# Patient Record
Sex: Female | Born: 1995 | Race: White | Hispanic: No | Marital: Married | Smoking: Current every day smoker
Health system: Southern US, Community
[De-identification: ages and names within clinical notes are randomized; demographics above are authoritative.]

## PROBLEM LIST (undated history)

## (undated) ENCOUNTER — Inpatient Hospital Stay (HOSPITAL_COMMUNITY): Payer: Self-pay

## (undated) DIAGNOSIS — R519 Headache, unspecified: Secondary | ICD-10-CM

## (undated) DIAGNOSIS — R51 Headache: Secondary | ICD-10-CM

## (undated) DIAGNOSIS — N390 Urinary tract infection, site not specified: Secondary | ICD-10-CM

## (undated) HISTORY — PX: TOOTH EXTRACTION: SUR596

## (undated) HISTORY — PX: NO PAST SURGERIES: SHX2092

---

## 2016-08-12 NOTE — L&D Delivery Note (Addendum)
Delivery Note Pt progressed to complete dilation and pushed well for about 20 minutes.  At 4:00 PM a healthy female was delivered via Vaginal, Spontaneous (Presentation: LOP  ).  APGAR: 8, 9; weight  pending.   Placenta status: delivered spontaneously .  Cord:  with the following complications:  Short cord  Anesthesia:  epidural Episiotomy: None Lacerations: 1st degree abrasion Suture Repair: 3.0 vicryl rapide Est. Blood Loss (mL): 175mL  Mom to postpartum.  Baby to Couplet care / Skin to Skin. 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    Pt BP has been intermittently elevated, but mostly with pain or pushing.  Labs WNL on admission, will send urine for prot:creat ratio and see how BP looks over next hour.  D/w parents circumcision and they desire.  D/w them office vs hospital options and they will consider.   Oliver PilaKathy W Nichalas Coin 06/17/2017, 4:25 PM

## 2017-01-03 LAB — OB RESULTS CONSOLE RUBELLA ANTIBODY, IGM: Rubella: IMMUNE

## 2017-01-03 LAB — OB RESULTS CONSOLE HEPATITIS B SURFACE ANTIGEN: Hepatitis B Surface Ag: NEGATIVE

## 2017-02-19 ENCOUNTER — Other Ambulatory Visit (HOSPITAL_COMMUNITY): Payer: Self-pay | Admitting: Obstetrics and Gynecology

## 2017-02-19 DIAGNOSIS — Z3A19 19 weeks gestation of pregnancy: Secondary | ICD-10-CM

## 2017-02-19 DIAGNOSIS — Z3689 Encounter for other specified antenatal screening: Secondary | ICD-10-CM

## 2017-02-21 ENCOUNTER — Encounter (HOSPITAL_COMMUNITY): Payer: Self-pay | Admitting: *Deleted

## 2017-02-25 ENCOUNTER — Ambulatory Visit (HOSPITAL_COMMUNITY)
Admission: RE | Admit: 2017-02-25 | Discharge: 2017-02-25 | Disposition: A | Discharge status: Home / Self Care | Payer: Medicaid Other | Source: Ambulatory Visit | Attending: Obstetrics and Gynecology | Admitting: Obstetrics and Gynecology

## 2017-02-25 ENCOUNTER — Encounter (HOSPITAL_COMMUNITY): Payer: Self-pay

## 2017-02-25 ENCOUNTER — Other Ambulatory Visit (HOSPITAL_COMMUNITY): Payer: Self-pay | Admitting: Obstetrics and Gynecology

## 2017-02-25 DIAGNOSIS — Z3689 Encounter for other specified antenatal screening: Secondary | ICD-10-CM

## 2017-02-25 DIAGNOSIS — O358XX0 Maternal care for other (suspected) fetal abnormality and damage, not applicable or unspecified: Secondary | ICD-10-CM

## 2017-02-25 DIAGNOSIS — O35HXX0 Maternal care for other (suspected) fetal abnormality and damage, fetal lower extremities anomalies, not applicable or unspecified: Secondary | ICD-10-CM

## 2017-02-25 DIAGNOSIS — Z3A19 19 weeks gestation of pregnancy: Secondary | ICD-10-CM

## 2017-02-25 DIAGNOSIS — Z363 Encounter for antenatal screening for malformations: Secondary | ICD-10-CM | POA: Diagnosis not present

## 2017-02-25 DIAGNOSIS — Z315 Encounter for genetic counseling: Secondary | ICD-10-CM | POA: Diagnosis present

## 2017-02-25 DIAGNOSIS — O99332 Smoking (tobacco) complicating pregnancy, second trimester: Secondary | ICD-10-CM

## 2017-02-25 DIAGNOSIS — O99212 Obesity complicating pregnancy, second trimester: Secondary | ICD-10-CM

## 2017-02-25 DIAGNOSIS — Z3686 Encounter for antenatal screening for cervical length: Secondary | ICD-10-CM | POA: Diagnosis not present

## 2017-02-26 ENCOUNTER — Other Ambulatory Visit (HOSPITAL_COMMUNITY): Payer: Self-pay | Admitting: *Deleted

## 2017-02-26 ENCOUNTER — Encounter (HOSPITAL_COMMUNITY): Payer: Self-pay

## 2017-02-26 DIAGNOSIS — O359XX0 Maternal care for (suspected) fetal abnormality and damage, unspecified, not applicable or unspecified: Secondary | ICD-10-CM

## 2017-02-27 DIAGNOSIS — O358XX0 Maternal care for other (suspected) fetal abnormality and damage, not applicable or unspecified: Secondary | ICD-10-CM | POA: Insufficient documentation

## 2017-02-27 DIAGNOSIS — Z3A19 19 weeks gestation of pregnancy: Secondary | ICD-10-CM | POA: Insufficient documentation

## 2017-02-27 DIAGNOSIS — O35HXX0 Maternal care for other (suspected) fetal abnormality and damage, fetal lower extremities anomalies, not applicable or unspecified: Secondary | ICD-10-CM | POA: Insufficient documentation

## 2017-02-27 NOTE — Progress Notes (Signed)
Genetic Counseling  High-Risk Gestation Note  Appointment Date:  02/25/2017 Referred By: Sharon [name], * Date of Birth:  17-Jun-1996 Partner:  Sharon [name]   Pregnancy History: G1P0 Estimated Date of Delivery: 07/17/17 Estimated Gestational Age: 70w5dAttending: [name]   I met with Mrs. Sharon Buczkowskiand her husband, Mr. 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 The patient's mother also accompanied the couple to today's visit.    In summary:  Discussed ultrasound findings in detail: right club foot visualized; facial profile unable to be assessed today [facility]  Reviewed options for additional screening  NIPS- elected to pursue Panorama today  Ongoing ultrasound  Reviewed options for diagnostic testing, including risks, benefits, limitations and alternatives  Amniocentesis- declined  Reviewed other explanations for ultrasound findings  Discussed option of prenatal consultation with pediatric orthopedics- couple would desire [place] facilitate this  Reviewed family history concerns  We began by reviewing the ultrasound in detail. Right club foot was visualized today; the left fetal foot was visualized within normal limits. Concern for absent nasal bone on outside ultrasound; facial profile not well visualized today. Remaining visualized fetal anatomy was within normal limits. Fetal heart and palate not well visualized. Complete ultrasound results under separate cover.   We discussed that clubfoot/feet is a term that actually describes three distinct anomalies (talipes equinovarus, talipes calcaneovalgus, and metatarsus varus) and occurs in 1 in 1000 births. The most common type of clubfeet, talipes equinovarus, is characterized by forefoot adduction with supination, heel varus, and ankle equines, which cannot be brought back to a neutral  position. They were counseled that clubfeet can be an isolated difference, occur as a feature of an underlying syndrome, or the result of neurological impairment. We discussed that the most likely mode of inheritance for nonsyndromic, isolated clubfoot/feet is multifactorial. There is a known genetic component for multifactorial clubfoot, as demonstrated by twin studies; environmental factors also play a role, including infection, drugs, and intrauterine environment (oligohydramnios, fetal positioning). While the majority of cases of clubfoot/feet are isolated, it is a feature in more than 200 known genetic syndromes, including both chromosomal and single gene conditions. We reviewed chromosomes, nondisjunction and the features of Down syndrome, trisomy 173 and 174 We discussed that the risk for other chromosome aberrations is slightly increased (microdeletions, microduplications, insertions, translocations). We reviewed single gene conditions including common inheritance patterns and associated risks for recurrence.   Given the possible concern for absent nasal bone, we reviewed the association with this as a soft marker for fetal aneuploidy.  An absent nasal bone is a highly sensitive and specific marker for Down syndrome.  It is present in approximately 1% of chromosomally normal fetuses, but up to 70% of fetuses with Down syndrome, 55 % with trisomy 18, and 34% with trisomy 13. The presence and length of the fetal nasal bone varies by race and ethnicity in second trimester fetuses.  The associated risk of aneuploidy is highest in Caucasians, with a likelihood ratio of approximately 50, in African Americans the likelihood ratio is approximately 8.  Given [name]'s maternal age of 21y.o., we discussed the approximate 1 in 1140 risk for fetal Down syndrome.  In the case of an ultrasound finding of an absent nasal bone, the adjusted risk for fetal Down syndrome would be approximately 1 in 23 (4.3%).   This  couple was counseled regarding the  option of noninvasive prenatal screening (NIPS)/cell free DNA (cfDNA) testing. We reviewed that although highly specific and sensitive, this testing is not considered to be diagnostic and does not detect all chromosome aberrations. We reviewed the detection and false positive rates of NIPS (Panorama) as well as the potential associated cost. We then discussed the option of amniocentesis, including the limitations, benefits, and risks. They understand that amniocentesis allows evaluation of the fetal chromosomes, but cannot detect all genetic conditions. Specifically, we discussed that single gene conditions are difficult to diagnose prenatally unless a specific condition is suspected based on additional ultrasound findings or family history. Additionally, we discussed the availability of microarray analysis, which can be performed pre and postnatally. They were counseled that microarray analysis is a molecular based technique in which a test sample of DNA (fetal) is compared to a reference (normal) genome in order to determine if the test sample has any extra or missing genetic information. Microarray analysis allows for the detection of genetic deletions and duplications that are 672 times smaller than those identified by routine chromosome analysis. We discussed that recent publications show that approximately 6% of patients with an abnormal fetal ultrasound and a normal fetal karyotype had a significant microdeletion/microduplication detected by prenatal microarray analysis. After thoughtful consideration, this couple elected to have NIPS (Panorama) today and declined amniocentesis. The results will be available in ~7-10 days. She understands that ultrasound cannot rule out all birth defects or genetic syndromes. The patient was advised of this limitation and states she still does not want diagnostic testing at this time.   We discussed the option of meeting with a pediatric  orthopedic specialist to discuss expectant management and treatment of clubfeet. They would like to pursue treatment for their child at [facility]. We will facilitate a prenatal consult with orthopedics for the couple. Follow-up ultrasound was scheduled in 4 weeks in our office.   Both family histories were reviewed and found to be contributory for Down syndrome for the patient's paternal first cousin once removed. The type of Down syndrome in this relative was not known. We discussed that 95% of cases of Down syndrome are not inherited and are the result of non-disjunction.  Three to 4% of cases of Down syndrome are the result of a translocation involving chromosome #21.  We discussed the option of chromosome analysis to determine if an individual is a carrier of a balanced translocation involving chromosome #21.  If an individual carries a balanced translocation involving chromosome #21, then the chance to have a baby with Down syndrome would be greater than the maternal age-related risk.  The reported family history is most suggestive of sporadically occurring Down syndrome. Additional information regarding the etiology for this relative may alter recurrence risk assessment.   [name] reported a maternal half-sister who had brain damage and possible cerebral palsy. She died at age 61 years old. The patient's mother reported that the features were most likely attributed to issues at labor and delivery. No underlying genetic etiology was determined to the best of the family's knowledge.  Cerebral palsy (CP) is a group of clinical syndromes that range in severity, characterized by abnormal muscle tone, posture, and movement. Cerebral palsy is due to abnormalities in the developing brain resulting from a variety of causes. The etiology is reported to be multifactorial, with most cases typically due to prenatal factors. Prematurity is the most common  association, but in many cases, no cause is identified. A  specific genetic cause for cerebral palsy has not been identified, and underlying genetic disorders are relatively uncommon in individuals with CP.  Given the reported family history, recurrence risk for the current pregnancy would likely be low. Additional information regarding this individual's underlying condition or etiology may alter recurrence risk assessment. Without further information regarding the provided family history, an accurate genetic risk cannot be calculated, and further prenatal genetic or screening would not likely be informative regarding this family history. Further genetic counseling is warranted if more information is obtained.  Additionally, [name] reportedly was identified on prenatal ultrasound to have short legs. She reported that her height is 5'2, and she feels that her legs are not proportionate to her body. She has not been diagnosed with an underlying form of skeletal dysplasia, to her knowledge and reported no additional medical concerns. The family histories were otherwise unremarkable for birth defects, intellectual disability, and known genetic conditions. Consanguinity was denied. Without further information regarding the provided family history, an accurate genetic risk cannot be calculated. Further genetic counseling is warranted if more information is obtained.  [name] was provided with written information regarding cystic fibrosis (CF), spinal muscular atrophy (SMA) and hemoglobinopathies including the carrier frequency, availability of carrier screening and prenatal diagnosis if indicated.  In addition, we discussed that CF and hemoglobinopathies are routinely screened for as part of the [place] newborn screening panel.  She declined screening for CF, SMA and hemoglobinopathies.  [name] denied exposure to environmental toxins or chemical agents. She denied the use of alcohol, tobacco or street  drugs. She denied significant viral illnesses during the course of her pregnancy. Her medical and surgical histories were noncontributory.   I counseled this couple regarding the above risks and available options.  The approximate face-to-face time with the genetic counselor was 40 minutes.  Chipper Oman, MS Certified Genetic Counselor 02/27/2017

## 2017-03-04 ENCOUNTER — Other Ambulatory Visit: Payer: Self-pay

## 2017-03-04 ENCOUNTER — Telehealth (HOSPITAL_COMMUNITY): Payer: Self-pay | Admitting: MS"

## 2017-03-04 NOTE — Telephone Encounter (Signed)
Called Sharon ShareFaith Flagler to discuss her prenatal cell free DNA test results.  [name] had Panorama testing through [facility].  Testing was offered because of ultrasound findings.   The patient was identified by name and DOB.  We reviewed that these are within normal limits, showing a less than 1 in 10,000 risk for trisomies 21, 18 and 13, and monosomy X (Turner syndrome).  In addition, the risk for triploidy and sex chromosome trisomies (47,XXX and 47,XXY) was also low risk.  We reviewed that this testing identifies > 99% of pregnancies with trisomy 2521, trisomy 5913, sex chromosome trisomies (47,XXX and 47,XXY), and triploidy. The detection rate for trisomy 18 is 98%.  The detection rate for monosomy X is ~92%.  The false positive rate is <0.1% for all conditions. Testing was also consistent with female fetal sex.  She understands that this testing does not identify all genetic conditions.  All questions were answered to her satisfaction, she was encouraged to call with additional questions or concerns.  Quinn PlowmanKaren Junella Domke, MS Certified Genetic Counselor 03/04/2017 3:46 PM

## 2017-03-26 ENCOUNTER — Encounter (HOSPITAL_COMMUNITY): Payer: Self-pay

## 2017-03-26 ENCOUNTER — Ambulatory Visit (HOSPITAL_COMMUNITY)
Admission: RE | Admit: 2017-03-26 | Discharge: 2017-03-26 | Disposition: A | Discharge status: Home / Self Care | Payer: Medicaid Other | Source: Ambulatory Visit | Attending: Obstetrics and Gynecology | Admitting: Obstetrics and Gynecology

## 2017-03-26 DIAGNOSIS — E669 Obesity, unspecified: Secondary | ICD-10-CM | POA: Diagnosis not present

## 2017-03-26 DIAGNOSIS — O99332 Smoking (tobacco) complicating pregnancy, second trimester: Secondary | ICD-10-CM | POA: Insufficient documentation

## 2017-03-26 DIAGNOSIS — Z6838 Body mass index (BMI) 38.0-38.9, adult: Secondary | ICD-10-CM | POA: Diagnosis not present

## 2017-03-26 DIAGNOSIS — O359XX0 Maternal care for (suspected) fetal abnormality and damage, unspecified, not applicable or unspecified: Secondary | ICD-10-CM | POA: Diagnosis not present

## 2017-03-26 DIAGNOSIS — Z3A23 23 weeks gestation of pregnancy: Secondary | ICD-10-CM | POA: Insufficient documentation

## 2017-03-26 DIAGNOSIS — O99212 Obesity complicating pregnancy, second trimester: Secondary | ICD-10-CM | POA: Insufficient documentation

## 2017-03-26 LAB — OB RESULTS CONSOLE ABO/RH: RH Type: POSITIVE

## 2017-04-23 LAB — OB RESULTS CONSOLE RPR: RPR: NONREACTIVE

## 2017-04-23 LAB — OB RESULTS CONSOLE HIV ANTIBODY (ROUTINE TESTING)
HIV: NONREACTIVE
HIV: NONREACTIVE

## 2017-05-20 ENCOUNTER — Inpatient Hospital Stay (HOSPITAL_COMMUNITY)
Admission: AD | Admit: 2017-05-20 | Discharge: 2017-05-21 | Disposition: A | Discharge status: Home / Self Care | Payer: Medicaid Other | Source: Ambulatory Visit | Attending: Obstetrics and Gynecology | Admitting: Obstetrics and Gynecology

## 2017-05-20 ENCOUNTER — Encounter (HOSPITAL_COMMUNITY): Payer: Self-pay | Admitting: *Deleted

## 2017-05-20 DIAGNOSIS — Z3689 Encounter for other specified antenatal screening: Secondary | ICD-10-CM

## 2017-05-20 DIAGNOSIS — R51 Headache: Secondary | ICD-10-CM | POA: Diagnosis present

## 2017-05-20 DIAGNOSIS — R42 Dizziness and giddiness: Secondary | ICD-10-CM | POA: Insufficient documentation

## 2017-05-20 DIAGNOSIS — O26893 Other specified pregnancy related conditions, third trimester: Secondary | ICD-10-CM | POA: Diagnosis present

## 2017-05-20 DIAGNOSIS — O99333 Smoking (tobacco) complicating pregnancy, third trimester: Secondary | ICD-10-CM | POA: Insufficient documentation

## 2017-05-20 DIAGNOSIS — Z3A31 31 weeks gestation of pregnancy: Secondary | ICD-10-CM | POA: Diagnosis not present

## 2017-05-20 DIAGNOSIS — O133 Gestational [pregnancy-induced] hypertension without significant proteinuria, third trimester: Secondary | ICD-10-CM | POA: Insufficient documentation

## 2017-05-20 HISTORY — DX: Urinary tract infection, site not specified: N39.0

## 2017-05-20 HISTORY — DX: Headache, unspecified: R51.9

## 2017-05-20 HISTORY — DX: Headache: R51

## 2017-05-20 NOTE — MAU Note (Signed)
Pt states she has a HA that started around 9pm. Went to fire dept to have BP checked and was 150/90. Pt has not taken anything for HA. Pt reports she is being monitored for HBP in office. Pt states every now and then she has blurry vision. Reports feels dizzy at times. Pt denies vaginal bleeding. Pt reports clear vaginal discharge. Reports decrease in fetal movement today.

## 2017-05-21 DIAGNOSIS — O133 Gestational [pregnancy-induced] hypertension without significant proteinuria, third trimester: Secondary | ICD-10-CM

## 2017-05-21 LAB — COMPREHENSIVE METABOLIC PANEL
ALBUMIN: 2.8 g/dL — AB (ref 3.5–5.0)
ALK PHOS: 106 U/L (ref 38–126)
ALT: 14 U/L (ref 14–54)
AST: 16 U/L (ref 15–41)
Anion gap: 10 (ref 5–15)
BUN: 7 mg/dL (ref 6–20)
CALCIUM: 8.9 mg/dL (ref 8.9–10.3)
CO2: 24 mmol/L (ref 22–32)
CREATININE: 0.5 mg/dL (ref 0.44–1.00)
Chloride: 105 mmol/L (ref 101–111)
GFR calc Af Amer: >60 mL/min (ref >60)
GFR calc non Af Amer: >60 mL/min (ref >60)
GLUCOSE: 86 mg/dL (ref 65–99)
POTASSIUM: 3.6 mmol/L (ref 3.5–5.1)
Sodium: 139 mmol/L (ref 135–145)
TOTAL PROTEIN: 6.7 g/dL (ref 6.5–8.1)
Total Bilirubin: 0.4 mg/dL (ref 0.3–1.2)

## 2017-05-21 LAB — URINALYSIS, ROUTINE W REFLEX MICROSCOPIC
Bilirubin Urine: NEGATIVE
Glucose, UA: NEGATIVE mg/dL
Hgb urine dipstick: NEGATIVE
Ketones, ur: NEGATIVE mg/dL
LEUKOCYTES UA: NEGATIVE
NITRITE: NEGATIVE
PROTEIN: NEGATIVE mg/dL
SPECIFIC GRAVITY, URINE: 1.008 (ref 1.005–1.030)
pH: 7 (ref 5.0–8.0)

## 2017-05-21 LAB — WET PREP, GENITAL
Clue Cells Wet Prep HPF POC: NONE SEEN
SPERM: NONE SEEN
Trich, Wet Prep: NONE SEEN
YEAST WET PREP: NONE SEEN

## 2017-05-21 LAB — CBC
HEMATOCRIT: 34.7 % — AB (ref 36.0–46.0)
HEMOGLOBIN: 11.9 g/dL — AB (ref 12.0–15.0)
MCH: 32.1 pg (ref 26.0–34.0)
MCHC: 34.3 g/dL (ref 30.0–36.0)
MCV: 93.5 fL (ref 78.0–100.0)
Platelets: 222 10*3/uL (ref 150–400)
RBC: 3.71 MIL/uL — AB (ref 3.87–5.11)
RDW: 13 % (ref 11.5–15.5)
WBC: 12.6 10*3/uL — AB (ref 4.0–10.5)

## 2017-05-21 LAB — PROTEIN / CREATININE RATIO, URINE
Creatinine, Urine: 58 mg/dL
Total Protein, Urine: <6 mg/dL

## 2017-05-21 LAB — OB RESULTS CONSOLE GC/CHLAMYDIA
Chlamydia: NEGATIVE
GC PROBE AMP, GENITAL: NEGATIVE
Gonorrhea: NEGATIVE

## 2017-05-21 LAB — GC/CHLAMYDIA PROBE AMP (~~LOC~~) NOT AT ARMC
CHLAMYDIA, DNA PROBE: NEGATIVE
Neisseria Gonorrhea: NEGATIVE

## 2017-05-21 MED ORDER — ACETAMINOPHEN 500 MG PO TABS
1000.0000 mg | ORAL_TABLET | Freq: Once | ORAL | Status: AC
  Administered 2017-05-21: 1000 mg via ORAL
  Filled 2017-05-21: qty 2

## 2017-05-21 NOTE — Progress Notes (Signed)
FHR from this am reviewed.  Reactive NST, no significant decels or regular ctx.

## 2017-05-21 NOTE — MAU Provider Note (Signed)
History     CSN: [id number]  Arrival date and time: 05/20/17 2301   First Provider Initiated Contact with Patient 05/21/17 0026      Chief Complaint  Patient presents with  . Headache  . Hypertension    HPI: Sharon [name] is a 21 y.o. G1P0 with IUP at [redacted]w[redacted]d who presents to maternity admissions d/t swelling, dizziness and elevated blood pressure. She reports she has noticed LE and UE swelling for the past few days, and dinze yesterday she has had intermittent dizziness associated with blurry vision and has had episodes of seeing flashes of light. She started having a headache around 9 pm, which she describes as dull and mild, but she was told to get her BP checked, which she did at the fire department, and her BP was 150/90. Denies RUQ or epigastric pain, SOB, or chest pain/pressure.   Denies contractions, leakage of fluid or vaginal bleeding, but reports she has been having a thin discharge for a few day with some mild odor. Also reports decreased fetal movement this evening. Reports only felt baby move twice in the last 2 hours, but states that this is since she has been going to fire department and here. Has had normal fetal movement throughout the day.   Past obstetric history: OB History  Gravida Para Term Preterm AB Living  1            SAB TAB Ectopic Multiple Live Births               # Outcome Date GA Lbr Len/2nd Weight Sex Delivery Anes PTL Lv  1 Current               Past Medical History:  Diagnosis Date  . Headache   . Medical history non-contributory   . UTI (urinary tract infection)     Past Surgical History:  Procedure Laterality Date  . NO PAST SURGERIES    . TOOTH EXTRACTION      Family History  Problem Relation Age of Onset  . Diabetes Maternal Grandmother     Social History  Substance Use Topics  . Smoking status: Current Every Day Smoker    Packs/day: 0.25    Types: Cigarettes  . Smokeless tobacco: Never Used  . Alcohol use No    Allergies:   Allergies  Allergen Reactions  . Penicillins     All "cillins"    Prescriptions Prior to Admission  Medication Sig Dispense Refill Last Dose  . Prenatal MV-Min-Fe Fum-FA-DHA (PRENATAL 1 PO) Take by mouth.   05/20/2017 at Unknown time    Review of Systems - Negative except for what is mentioned in HPI.  Physical Exam   Blood pressure (!) 141/86, pulse (!) 109, temperature 98.1 F (36.7 C), temperature source Oral, resp. rate 17, height (P)  (1.575 m), weight (P) 234 lb (106.1 kg), last menstrual period 10/10/2016.  Constitutional: Well-developed, well-nourished female in no acute distress.  HENT: [place]/AT, normal oropharynx mucosa. MMM. Eyes: normal conjunctivae, no scleral icterus. EOMI. PERRL. Cardiovascular: normal rate, regular rhythm Respiratory: normal effort, lungs CTAB, no wheezes, rales or ronchi GI: Abd soft, non-tender, gravid appropriate for gestational age.   Pelvic: NEFG, physiologic discharge, no blood, no pooling, cervix closed. MSK: Bilateral feet and ankle edema, nontender Ext: DP pulses 2+ bialterally Neurologic: Alert and oriented x 4. Cns II-XII grossly intact. Patellar DTRs 2+ bilaterally, no clonus Psych: Normal mood and affect Skin: warm and dry   FHT:  Baseline 135 ,  moderate variability, accelerations present, no decelerations Toco: none  MAU Course  Procedures  MDM Patient with elevated BPs. SBP randing form 131 to 148 and DBP 83 to 92.  Reactive NST PIH labs ordered and reviewed. Results for orders placed or performed during the hospital encounter of 05/20/17  Wet prep, genital  Result Value Ref Range   Yeast Wet Prep HPF POC NONE SEEN NONE SEEN   Trich, Wet Prep NONE SEEN NONE SEEN   Clue Cells Wet Prep HPF POC NONE SEEN NONE SEEN   WBC, Wet Prep HPF POC FEW (A) NONE SEEN   Sperm NONE SEEN   Urinalysis, Routine w reflex microscopic  Result Value Ref Range   Color, Urine YELLOW YELLOW   APPearance HAZY (A) CLEAR   Specific Gravity,  Urine 1.008 1.005 - 1.030   pH 7.0 5.0 - 8.0   Glucose, UA NEGATIVE NEGATIVE mg/dL   Hgb urine dipstick NEGATIVE NEGATIVE   Bilirubin Urine NEGATIVE NEGATIVE   Ketones, ur NEGATIVE NEGATIVE mg/dL   Protein, ur NEGATIVE NEGATIVE mg/dL   Nitrite NEGATIVE NEGATIVE   Leukocytes, UA NEGATIVE NEGATIVE  CBC  Result Value Ref Range   WBC 12.6 (H) 4.0 - 10.5 K/uL   RBC 3.71 (L) 3.87 - 5.11 MIL/uL   Hemoglobin 11.9 (L) 12.0 - 15.0 g/dL   HCT 30.1 (L) 60.1 - 09.3 %   MCV 93.5 78.0 - 100.0 fL   MCH 32.1 26.0 - 34.0 pg   MCHC 34.3 30.0 - 36.0 g/dL   RDW 23.5 57.3 - 22.0 %   Platelets 222 150 - 400 K/uL  Comprehensive metabolic panel  Result Value Ref Range   Sodium 139 135 - 145 mmol/L   Potassium 3.6 3.5 - 5.1 mmol/L   Chloride 105 101 - 111 mmol/L   CO2 24 22 - 32 mmol/L   Glucose, Bld 86 65 - 99 mg/dL   BUN 7 6 - 20 mg/dL   Creatinine, Ser 2.54 0.44 - 1.00 mg/dL   Calcium 8.9 8.9 - 27.0 mg/dL   Total Protein 6.7 6.5 - 8.1 g/dL   Albumin 2.8 (L) 3.5 - 5.0 g/dL   AST 16 15 - 41 U/L   ALT 14 14 - 54 U/L   Alkaline Phosphatase 106 38 - 126 U/L   Total Bilirubin 0.4 0.3 - 1.2 mg/dL   GFR calc non Af Amer >60 >60 mL/min   GFR calc Af Amer >60 >60 mL/min   Anion gap 10 5 - 15  Protein / creatinine ratio, urine  Result Value Ref Range   Creatinine, Urine 58.00 mg/dL   Total Protein, Urine <6 mg/dL   Protein Creatinine Ratio        0.00 - 0.15 mg/mg[Cre]   PIH labs wnl. Vaginal discharge also evaluated. Neg wet prep. GC probe sent   Dicussed with Dr. Jackelyn Knife. Plan to d/c with close follow up. Pt has appt already scheduled in 2 days.  Assessment and Plan  Assessment: 1. Pregnancy-induced hypertension in third trimester   2. NST (non-stress test) reactive     Plan: --Discharge home in stable condition. Dicussed return precautions in great length. --Follow up in 2 days (she already has an appt).   Marjo Grosvenor, Kandra Nicolas, MD 05/21/2017 12:49 AM

## 2017-05-21 NOTE — Discharge Instructions (Signed)
Preeclampsia and Eclampsia °Preeclampsia is a serious condition that develops only during pregnancy. It is also called toxemia of pregnancy. This condition causes high blood pressure along with other symptoms, such as swelling and headaches. These symptoms may develop as the condition gets worse. Preeclampsia may occur at 20 weeks of pregnancy or later. °Diagnosing and treating preeclampsia early is very important. If not treated early, it can cause serious problems for you and your baby. One problem it can lead to is eclampsia, which is a condition that causes muscle jerking or shaking (convulsions or seizures) in the mother. Delivering your baby is the best treatment for preeclampsia or eclampsia. Preeclampsia and eclampsia symptoms usually go away after your baby is born. °What are the causes? °The cause of preeclampsia is not known. °What increases the risk? °The following risk factors make you more likely to develop preeclampsia: °· Being pregnant for the first time. °· Having had preeclampsia during a past pregnancy. °· Having a family history of preeclampsia. °· Having high blood pressure. °· Being pregnant with twins or triplets. °· Being 35 or older. °· Being African-American. °· Having kidney disease or diabetes. °· Having medical conditions such as lupus or blood diseases. °· Being very overweight (obese). ° °What are the signs or symptoms? °The earliest signs of preeclampsia are: °· High blood pressure. °· Increased protein in your urine. Your health care provider will check for this at every visit before you give birth (prenatal visit). ° °Other symptoms that may develop as the condition gets worse include: °· Severe headaches. °· Sudden weight gain. °· Swelling of the hands, face, legs, and feet. °· Nausea and vomiting. °· Vision problems, such as blurred or double vision. °· Numbness in the face, arms, legs, and feet. °· Urinating less than usual. °· Dizziness. °· Slurred speech. °· Abdominal pain,  especially upper abdominal pain. °· Convulsions or seizures. ° °Symptoms generally go away after giving birth. °How is this diagnosed? °There are no screening tests for preeclampsia. Your health care provider will ask you about symptoms and check for signs of preeclampsia during your prenatal visits. You may also have tests that include: °· Urine tests. °· Blood tests. °· Checking your blood pressure. °· Monitoring your baby’s heart rate. °· Ultrasound. ° °How is this treated? °You and your health care provider will determine the treatment approach that is best for you. Treatment may include: °· Having more frequent prenatal exams to check for signs of preeclampsia, if you have an increased risk for preeclampsia. °· Bed rest. °· Reducing how much salt (sodium) you eat. °· Medicine to lower your blood pressure. °· Staying in the hospital, if your condition is severe. There, treatment will focus on controlling your blood pressure and the amount of fluids in your body (fluid retention). °· You may need to take medicine (magnesium sulfate) to prevent seizures. This medicine may be given as an injection or through an IV tube. °· Delivering your baby early, if your condition gets worse. You may have your labor started with medicine (induced), or you may have a cesarean delivery. ° °Follow these instructions at home: °Eating and drinking ° °· Drink enough fluid to keep your urine clear or pale yellow. °· Eat a healthy diet that is low in sodium. Do not add salt to your food. Check nutrition labels to see how much sodium a food or beverage contains. °· Avoid caffeine. °Lifestyle °· Do not use any products that contain nicotine or tobacco, such as cigarettes   and e-cigarettes. If you need help quitting, ask your health care provider. °· Do not use alcohol or drugs. °· Avoid stress as much as possible. Rest and get plenty of sleep. °General instructions °· Take over-the-counter and prescription medicines only as told by your  health care provider. °· When lying down, lie on your side. This keeps pressure off of your baby. °· When sitting or lying down, raise (elevate) your feet. Try putting some pillows underneath your lower legs. °· Exercise regularly. Ask your health care provider what kinds of exercise are best for you. °· Keep all follow-up and prenatal visits as told by your health care provider. This is important. °How is this prevented? °To prevent preeclampsia or eclampsia from developing during another pregnancy: °· Get proper medical care during pregnancy. Your health care provider may be able to prevent preeclampsia or diagnose and treat it early. °· Your health care provider may have you take a low-dose aspirin or a calcium supplement during your next pregnancy. °· You may have tests of your blood pressure and kidney function after giving birth. °· Maintain a healthy weight. Ask your health care provider for help managing weight gain during pregnancy. °· Work with your health care provider to manage any long-term (chronic) health conditions you have, such as diabetes or kidney problems. ° °Contact a health care provider if: °· You gain more weight than expected. °· You have headaches. °· You have nausea or vomiting. °· You have abdominal pain. °· You feel dizzy or light-headed. °Get help right away if: °· You develop sudden or severe swelling anywhere in your body. This usually happens in the legs. °· You gain 5 lbs (2.3 kg) or more during one week. °· You have severe: °? Abdominal pain. °? Headaches. °? Dizziness. °? Vision problems. °? Confusion. °? Nausea or vomiting. °· You have a seizure. °· You have trouble moving any part of your body. °· You develop numbness in any part of your body. °· You have trouble speaking. °· You have any abnormal bleeding. °· You pass out. °This information is not intended to replace advice given to you by your health care provider. Make sure you discuss any questions you have with your health  care provider. °Document Released: 07/26/2000 Document Revised: 03/26/2016 Document Reviewed: 03/04/2016 °Elsevier Interactive Patient Education © 2018 [employer] ° °

## 2017-05-22 ENCOUNTER — Inpatient Hospital Stay (HOSPITAL_COMMUNITY)
Admission: AD | Admit: 2017-05-22 | Discharge: 2017-05-22 | Disposition: A | Discharge status: Home / Self Care | Payer: Medicaid Other | Source: Ambulatory Visit | Attending: Obstetrics and Gynecology | Admitting: Obstetrics and Gynecology

## 2017-05-22 ENCOUNTER — Encounter (HOSPITAL_COMMUNITY): Payer: Self-pay | Admitting: *Deleted

## 2017-05-22 DIAGNOSIS — O133 Gestational [pregnancy-induced] hypertension without significant proteinuria, third trimester: Secondary | ICD-10-CM | POA: Diagnosis not present

## 2017-05-22 DIAGNOSIS — Z3A33 33 weeks gestation of pregnancy: Secondary | ICD-10-CM | POA: Insufficient documentation

## 2017-05-22 DIAGNOSIS — O35HXX0 Maternal care for other (suspected) fetal abnormality and damage, fetal lower extremities anomalies, not applicable or unspecified: Secondary | ICD-10-CM

## 2017-05-22 DIAGNOSIS — O358XX0 Maternal care for other (suspected) fetal abnormality and damage, not applicable or unspecified: Secondary | ICD-10-CM

## 2017-05-22 LAB — CBC
HEMATOCRIT: 35.6 % — AB (ref 36.0–46.0)
HEMOGLOBIN: 12.3 g/dL (ref 12.0–15.0)
MCH: 32.1 pg (ref 26.0–34.0)
MCHC: 34.6 g/dL (ref 30.0–36.0)
MCV: 93 fL (ref 78.0–100.0)
Platelets: 232 10*3/uL (ref 150–400)
RBC: 3.83 MIL/uL — ABNORMAL LOW (ref 3.87–5.11)
RDW: 13 % (ref 11.5–15.5)
WBC: 13.7 10*3/uL — ABNORMAL HIGH (ref 4.0–10.5)

## 2017-05-22 LAB — COMPREHENSIVE METABOLIC PANEL
ALBUMIN: 2.8 g/dL — AB (ref 3.5–5.0)
ALK PHOS: 109 U/L (ref 38–126)
ALT: 13 U/L — ABNORMAL LOW (ref 14–54)
ANION GAP: 10 (ref 5–15)
AST: 21 U/L (ref 15–41)
BILIRUBIN TOTAL: 0.3 mg/dL (ref 0.3–1.2)
BUN: <5 mg/dL — ABNORMAL LOW (ref 6–20)
CALCIUM: 8.6 mg/dL — AB (ref 8.9–10.3)
CO2: 22 mmol/L (ref 22–32)
Chloride: 106 mmol/L (ref 101–111)
Creatinine, Ser: 0.53 mg/dL (ref 0.44–1.00)
GFR calc Af Amer: >60 mL/min (ref >60)
GFR calc non Af Amer: >60 mL/min (ref >60)
GLUCOSE: 103 mg/dL — AB (ref 65–99)
Potassium: 3.5 mmol/L (ref 3.5–5.1)
Sodium: 138 mmol/L (ref 135–145)
TOTAL PROTEIN: 6.8 g/dL (ref 6.5–8.1)

## 2017-05-22 LAB — URINALYSIS, ROUTINE W REFLEX MICROSCOPIC
BILIRUBIN URINE: NEGATIVE
Glucose, UA: NEGATIVE mg/dL
Hgb urine dipstick: NEGATIVE
KETONES UR: NEGATIVE mg/dL
Nitrite: NEGATIVE
PH: 8 (ref 5.0–8.0)
PROTEIN: NEGATIVE mg/dL
Specific Gravity, Urine: 1.011 (ref 1.005–1.030)

## 2017-05-22 LAB — PROTEIN / CREATININE RATIO, URINE
Creatinine, Urine: 66 mg/dL
PROTEIN CREATININE RATIO: 0.2 mg/mg{creat} — AB (ref 0.00–0.15)
Total Protein, Urine: 13 mg/dL

## 2017-05-22 MED ORDER — BETAMETHASONE SOD PHOS & ACET 6 (3-3) MG/ML IJ SUSP
12.0000 mg | Freq: Once | INTRAMUSCULAR | Status: AC
  Administered 2017-05-22: 12 mg via INTRAMUSCULAR
  Filled 2017-05-22: qty 2

## 2017-05-22 NOTE — MAU Note (Signed)
Urine in lab 

## 2017-05-22 NOTE — MAU Note (Signed)
Pt sent from office for gestational Hypertension. Need to be monitored and lab work and shot for baby's lungs. Denies H/A  Or visual changes.

## 2017-05-23 ENCOUNTER — Inpatient Hospital Stay (EMERGENCY_DEPARTMENT_HOSPITAL)
Admission: AD | Admit: 2017-05-23 | Discharge: 2017-05-24 | Disposition: A | Discharge status: Home / Self Care | Payer: Medicaid Other | Source: Ambulatory Visit | Attending: Obstetrics and Gynecology | Admitting: Obstetrics and Gynecology

## 2017-05-23 ENCOUNTER — Inpatient Hospital Stay (HOSPITAL_COMMUNITY)
Admission: AD | Admit: 2017-05-23 | Discharge: 2017-05-23 | Disposition: A | Discharge status: Home / Self Care | Payer: Medicaid Other | Source: Ambulatory Visit | Attending: Obstetrics and Gynecology | Admitting: Obstetrics and Gynecology

## 2017-05-23 ENCOUNTER — Encounter (HOSPITAL_COMMUNITY): Payer: Self-pay

## 2017-05-23 ENCOUNTER — Inpatient Hospital Stay (HOSPITAL_COMMUNITY): Payer: Medicaid Other

## 2017-05-23 DIAGNOSIS — R51 Headache: Secondary | ICD-10-CM | POA: Diagnosis not present

## 2017-05-23 DIAGNOSIS — O99213 Obesity complicating pregnancy, third trimester: Secondary | ICD-10-CM | POA: Insufficient documentation

## 2017-05-23 DIAGNOSIS — F17210 Nicotine dependence, cigarettes, uncomplicated: Secondary | ICD-10-CM | POA: Diagnosis not present

## 2017-05-23 DIAGNOSIS — O359XX0 Maternal care for (suspected) fetal abnormality and damage, unspecified, not applicable or unspecified: Secondary | ICD-10-CM | POA: Diagnosis not present

## 2017-05-23 DIAGNOSIS — Z3A32 32 weeks gestation of pregnancy: Secondary | ICD-10-CM | POA: Insufficient documentation

## 2017-05-23 DIAGNOSIS — O35HXX0 Maternal care for other (suspected) fetal abnormality and damage, fetal lower extremities anomalies, not applicable or unspecified: Secondary | ICD-10-CM

## 2017-05-23 DIAGNOSIS — O133 Gestational [pregnancy-induced] hypertension without significant proteinuria, third trimester: Secondary | ICD-10-CM

## 2017-05-23 DIAGNOSIS — O368130 Decreased fetal movements, third trimester, not applicable or unspecified: Secondary | ICD-10-CM | POA: Insufficient documentation

## 2017-05-23 DIAGNOSIS — Z833 Family history of diabetes mellitus: Secondary | ICD-10-CM | POA: Insufficient documentation

## 2017-05-23 DIAGNOSIS — O99333 Smoking (tobacco) complicating pregnancy, third trimester: Secondary | ICD-10-CM | POA: Diagnosis not present

## 2017-05-23 DIAGNOSIS — O26893 Other specified pregnancy related conditions, third trimester: Secondary | ICD-10-CM | POA: Diagnosis not present

## 2017-05-23 DIAGNOSIS — Z880 Allergy status to penicillin: Secondary | ICD-10-CM | POA: Insufficient documentation

## 2017-05-23 DIAGNOSIS — O358XX0 Maternal care for other (suspected) fetal abnormality and damage, not applicable or unspecified: Secondary | ICD-10-CM

## 2017-05-23 DIAGNOSIS — Z3689 Encounter for other specified antenatal screening: Secondary | ICD-10-CM

## 2017-05-23 LAB — CBC WITH DIFFERENTIAL/PLATELET
BASOS ABS: 0 10*3/uL (ref 0.0–0.1)
Basophils Relative: 0 %
Eosinophils Absolute: 0 10*3/uL (ref 0.0–0.7)
Eosinophils Relative: 0 %
HEMATOCRIT: 35.4 % — AB (ref 36.0–46.0)
HEMOGLOBIN: 12 g/dL (ref 12.0–15.0)
LYMPHS PCT: 11 %
Lymphs Abs: 1.6 10*3/uL (ref 0.7–4.0)
MCH: 31.9 pg (ref 26.0–34.0)
MCHC: 33.9 g/dL (ref 30.0–36.0)
MCV: 94.1 fL (ref 78.0–100.0)
MONO ABS: 0.4 10*3/uL (ref 0.1–1.0)
MONOS PCT: 3 %
NEUTROS ABS: 12.1 10*3/uL — AB (ref 1.7–7.7)
NEUTROS PCT: 86 %
Platelets: 231 10*3/uL (ref 150–400)
RBC: 3.76 MIL/uL — ABNORMAL LOW (ref 3.87–5.11)
RDW: 12.9 % (ref 11.5–15.5)
WBC: 14.1 10*3/uL — ABNORMAL HIGH (ref 4.0–10.5)

## 2017-05-23 LAB — URINALYSIS, ROUTINE W REFLEX MICROSCOPIC
Bilirubin Urine: NEGATIVE
Hgb urine dipstick: NEGATIVE
Ketones, ur: NEGATIVE mg/dL
Leukocytes, UA: NEGATIVE
Nitrite: NEGATIVE
PH: 6 (ref 5.0–8.0)
Protein, ur: NEGATIVE mg/dL
Specific Gravity, Urine: 1.007 (ref 1.005–1.030)

## 2017-05-23 LAB — PROTEIN / CREATININE RATIO, URINE
Creatinine, Urine: 52 mg/dL
Protein Creatinine Ratio: 0.25 mg/mg{Cre} — ABNORMAL HIGH (ref 0.00–0.15)
Total Protein, Urine: 13 mg/dL

## 2017-05-23 LAB — COMPREHENSIVE METABOLIC PANEL
ALK PHOS: 107 U/L (ref 38–126)
ALT: 17 U/L (ref 14–54)
AST: 21 U/L (ref 15–41)
Albumin: 2.9 g/dL — ABNORMAL LOW (ref 3.5–5.0)
Anion gap: 10 (ref 5–15)
BILIRUBIN TOTAL: 0.4 mg/dL (ref 0.3–1.2)
CALCIUM: 8.6 mg/dL — AB (ref 8.9–10.3)
CHLORIDE: 106 mmol/L (ref 101–111)
CO2: 22 mmol/L (ref 22–32)
CREATININE: 0.4 mg/dL — AB (ref 0.44–1.00)
GFR calc Af Amer: >60 mL/min (ref >60)
Glucose, Bld: 114 mg/dL — ABNORMAL HIGH (ref 65–99)
Potassium: 4 mmol/L (ref 3.5–5.1)
Sodium: 138 mmol/L (ref 135–145)
Total Protein: 7.1 g/dL (ref 6.5–8.1)

## 2017-05-23 MED ORDER — BUTALBITAL-APAP-CAFFEINE 50-325-40 MG PO TABS
1.0000 | ORAL_TABLET | Freq: Once | ORAL | Status: AC
  Administered 2017-05-23: 1 via ORAL
  Filled 2017-05-23: qty 1

## 2017-05-23 MED ORDER — BETAMETHASONE SOD PHOS & ACET 6 (3-3) MG/ML IJ SUSP
12.0000 mg | Freq: Once | INTRAMUSCULAR | Status: AC
  Administered 2017-05-23: 12 mg via INTRAMUSCULAR
  Filled 2017-05-23: qty 2

## 2017-05-23 NOTE — MAU Provider Note (Signed)
History     CSN: [id number]  Arrival date and time: 05/23/17 2216   First Provider Initiated Contact with Patient 05/23/17 2250      Chief Complaint  Patient presents with  . Headache   HPI  [name] is a 21 y.o. G1P0 at [redacted]w[redacted]d who presents to MAU today with complaint of headache and floaters. The patient has been seen in MAU multiple times over the last few days. She was recently diagnosed with GHTN. She has had BMZ x 2. She states that she took a nap this evening and woke up around 1930 with a headache. She also noted floaters at that time. She tried Tylenol around 2145 without relief. She denies RUQ pain, peripheral edema, contractions, vaginal bleeding or LOF today. She has noted decreased FM over the last few days and states only FM noted today was at 1600.   OB History    Gravida Para Term Preterm AB Living   1             SAB TAB Ectopic Multiple Live Births                  Past Medical History:  Diagnosis Date  . Headache   . Medical history non-contributory   . UTI (urinary tract infection)     Past Surgical History:  Procedure Laterality Date  . NO PAST SURGERIES    . TOOTH EXTRACTION      Family History  Problem Relation Age of Onset  . Diabetes Maternal Grandmother     Social History  Substance Use Topics  . Smoking status: Current Every Day Smoker    Packs/day: 0.25    Types: Cigarettes  . Smokeless tobacco: Never Used  . Alcohol use No    Allergies:  Allergies  Allergen Reactions  . Penicillins     All "cillins"    No prescriptions prior to admission.    Review of Systems  Constitutional: Negative for fever.  Eyes: Positive for visual disturbance.  Cardiovascular: Negative for leg swelling.  Gastrointestinal: Negative for abdominal pain, constipation, diarrhea, nausea and vomiting.  Genitourinary: Negative for vaginal bleeding and vaginal discharge.  Neurological: Positive for headaches.   Physical Exam   Blood pressure  134/73, pulse (!) 101, temperature 98.3 F (36.8 C), temperature source Oral, resp. rate 18, last menstrual period 10/10/2016, SpO2 98 %.  Physical Exam  Nursing note and vitals reviewed. Constitutional: She is oriented to person, place, and time. She appears well-developed and well-nourished. No distress.  HENT:  Head: Normocephalic and atraumatic.  Cardiovascular: Normal rate.   Respiratory: Effort normal.  GI: Soft. She exhibits no distension and no mass. There is no tenderness. There is no rebound and no guarding.  Musculoskeletal: She exhibits no edema.  Neurological: She is alert and oriented to person, place, and time. She has normal reflexes.  No clonus  Skin: Skin is warm and dry. No erythema.  Psychiatric: She has a normal mood and affect.    Results for orders placed or performed during the hospital encounter of 05/23/17 (from the past 24 hour(s))  Urinalysis, Routine w reflex microscopic     Status: Abnormal   Collection Time: 05/23/17 10:25 PM  Result Value Ref Range   Color, Urine YELLOW YELLOW   APPearance HAZY (A) CLEAR   Specific Gravity, Urine 1.007 1.005 - 1.030   pH 6.0 5.0 - 8.0   Glucose, UA >=500 (A) NEGATIVE mg/dL   Hgb urine dipstick NEGATIVE NEGATIVE  Bilirubin Urine NEGATIVE NEGATIVE   Ketones, ur NEGATIVE NEGATIVE mg/dL   Protein, ur NEGATIVE NEGATIVE mg/dL   Nitrite NEGATIVE NEGATIVE   Leukocytes, UA NEGATIVE NEGATIVE   RBC / HPF 0-5 0 - 5 RBC/hpf   WBC, UA 0-5 0 - 5 WBC/hpf   Bacteria, UA MANY (A) NONE SEEN   Squamous Epithelial / LPF 0-5 (A) NONE SEEN   Mucus PRESENT   Protein / creatinine ratio, urine     Status: Abnormal   Collection Time: 05/23/17 10:25 PM  Result Value Ref Range   Creatinine, Urine 52.00 mg/dL   Total Protein, Urine 13 mg/dL   Protein Creatinine Ratio 0.25 (H) 0.00 - 0.15 mg/mg[Cre]  CBC with Differential/Platelet     Status: Abnormal   Collection Time: 05/23/17 11:07 PM  Result Value Ref Range   WBC 14.1 (H) 4.0 -  10.5 K/uL   RBC 3.76 (L) 3.87 - 5.11 MIL/uL   Hemoglobin 12.0 12.0 - 15.0 g/dL   HCT 91.4 (L) 78.2 - 95.6 %   MCV 94.1 78.0 - 100.0 fL   MCH 31.9 26.0 - 34.0 pg   MCHC 33.9 30.0 - 36.0 g/dL   RDW 21.3 08.6 - 57.8 %   Platelets 231 150 - 400 K/uL   Neutrophils Relative % 86 %   Neutro Abs 12.1 (H) 1.7 - 7.7 K/uL   Lymphocytes Relative 11 %   Lymphs Abs 1.6 0.7 - 4.0 K/uL   Monocytes Relative 3 %   Monocytes Absolute 0.4 0.1 - 1.0 K/uL   Eosinophils Relative 0 %   Eosinophils Absolute 0.0 0.0 - 0.7 K/uL   Basophils Relative 0 %   Basophils Absolute 0.0 0.0 - 0.1 K/uL  Comprehensive metabolic panel     Status: Abnormal   Collection Time: 05/23/17 11:07 PM  Result Value Ref Range   Sodium 138 135 - 145 mmol/L   Potassium 4.0 3.5 - 5.1 mmol/L   Chloride 106 101 - 111 mmol/L   CO2 22 22 - 32 mmol/L   Glucose, Bld 114 (H) 65 - 99 mg/dL   BUN <5 (L) 6 - 20 mg/dL   Creatinine, Ser 4.69 (L) 0.44 - 1.00 mg/dL   Calcium 8.6 (L) 8.9 - 10.3 mg/dL   Total Protein 7.1 6.5 - 8.1 g/dL   Albumin 2.9 (L) 3.5 - 5.0 g/dL   AST 21 15 - 41 U/L   ALT 17 14 - 54 U/L   Alkaline Phosphatase 107 38 - 126 U/L   Total Bilirubin 0.4 0.3 - 1.2 mg/dL   GFR calc non Af Amer >60 >60 mL/min   GFR calc Af Amer >60 >60 mL/min   Anion gap 10 5 - 15   Fetal Monitoring: Baseline: 140 bpm Variability: moderate Accelerations: 15 x 15 Decelerations: none Contractions: none  Patient Vitals for the past 24 hrs:  BP Temp Temp src Pulse Resp SpO2  05/24/17 0024 134/73 98.3 F (36.8 C) Oral (!) 101 18 -  05/23/17 2317 - - - - - 98 %  05/23/17 2315 135/89 - - (!) 109 - -  05/23/17 2312 - - - - - 98 %  05/23/17 2307 - - - - - 96 %  05/23/17 2302 - - - - - 94 %  05/23/17 2300 131/83 - - (!) 101 - -  05/23/17 2257 - - - - - 95 %  05/23/17 2252 - - - - - 94 %  05/23/17 2247 133/86 - - (!) 115 -  94 %  05/23/17 2244 140/82 98.5 F (36.9 C) Oral (!) 109 18 93 %  05/23/17 2239 - - - - - 94 %  05/23/17 2234 - - -  - - 95 %    MAU Course  Procedures None  MDM CBC, CMP, UA and Urine protein/creatinine ratio Serial BPs BPP today for non-reactive NST and decreased FM. NST reactive prior to leaving for [place]. BPP 6/8 with -2 for breathing.  Fioricet given for headache. Patient reports improvement.  Discussed patient and results with Dr. Jackelyn Knife. Ok for discharge at this time with precautions. Encourage patient to call with concerns over the weekend and follow-up as scheduled on Monday.   Assessment and Plan  A: SIUP at [redacted]w[redacted]d GHTN Reactive NST  P: Discharge home Tylenol PRN for pain adivsed  Pre-eclampsia precautions discussed Patient advised to follow-up with [facility] as scheduled on Monday Patient may return to MAU as needed or if her condition were to change or worsen   Vonzella Nipple, PA-C 05/24/2017, 12:52 AM

## 2017-05-23 NOTE — MAU Note (Signed)
Pt her for 2nd dose of betamethasone. No problems yesterday.

## 2017-05-23 NOTE — MAU Note (Signed)
Was seen here for blood pressure yesterday and day before. At 8 pm started having headache, seeing spots, and felt really hot.  Took tylenol ES 2 tabs at 9:45 pm. No leaking. No bleeding. Felt baby move at 4 pm when she ate pudding but other than that, has not felt baby move in 3 days. Yesterday felt pain under right breast x 2 hours but went away.  Having left lower abd pain, just started, just an uncomfortable pain.

## 2017-05-24 DIAGNOSIS — O368130 Decreased fetal movements, third trimester, not applicable or unspecified: Secondary | ICD-10-CM

## 2017-05-24 DIAGNOSIS — O133 Gestational [pregnancy-induced] hypertension without significant proteinuria, third trimester: Secondary | ICD-10-CM | POA: Diagnosis not present

## 2017-05-24 NOTE — Discharge Instructions (Signed)
Hypertension During Pregnancy °Hypertension is also called high blood pressure. High blood pressure means that the force of your blood moving in your body is too strong. When you are pregnant, this condition should be watched carefully. It can cause problems for you and your baby. °Follow these instructions at home: °Eating and drinking °· Drink enough fluid to keep your pee (urine) clear or pale yellow. °· Eat healthy foods that are low in salt (sodium). °? Do not add salt to your food. °? Check labels on foods and drinks to see much salt is in them. Look on the label where you see "Sodium." °Lifestyle °· Do not use any products that contain nicotine or tobacco, such as cigarettes and e-cigarettes. If you need help quitting, ask your doctor. °· Do not use alcohol. °· Avoid caffeine. °· Avoid stress. Rest and get plenty of sleep. °General instructions °· Take over-the-counter and prescription medicines only as told by your doctor. °· While lying down, lie on your left side. This keeps pressure off your baby. °· While sitting or lying down, raise (elevate) your feet. Try putting some pillows under your lower legs. °· Exercise regularly. Ask your doctor what kinds of exercise are best for you. °· Keep all prenatal and follow-up visits as told by your doctor. This is important. °Contact a doctor if: °· You have symptoms that your doctor told you to watch for, such as: °? Fever. °? Throwing up (vomiting). °? Headache. °Get help right away if: °· You have very bad pain in your belly (abdomen). °· You are throwing up, and this does not get better with treatment. °· You suddenly get swelling in your hands, ankles, or face. °· You gain 4 lb (1.8 kg) or more in 1 week. °· You get bleeding from your vagina. °· You have blood in your pee. °· You do not feel your baby moving as much as normal. °· You have a change in vision. °· You have muscle twitching or sudden tightening (spasms). °· You have trouble breathing. °· Your lips  or fingernails turn blue. °This information is not intended to replace advice given to you by your health care provider. Make sure you discuss any questions you have with your health care provider. °Document Released: 08/31/2010 Document Revised: 04/09/2016 Document Reviewed: 04/09/2016 °Elsevier Interactive Patient Education © 2017 [employer] °Preeclampsia and Eclampsia °Preeclampsia is a serious condition that develops only during pregnancy. It is also called toxemia of pregnancy. This condition causes high blood pressure along with other symptoms, such as swelling and headaches. These symptoms may develop as the condition gets worse. Preeclampsia may occur at 20 weeks of pregnancy or later. °Diagnosing and treating preeclampsia early is very important. If not treated early, it can cause serious problems for you and your baby. One problem it can lead to is eclampsia, which is a condition that causes muscle jerking or shaking (convulsions or seizures) in the mother. Delivering your baby is the best treatment for preeclampsia or eclampsia. Preeclampsia and eclampsia symptoms usually go away after your baby is born. °What are the causes? °The cause of preeclampsia is not known. °What increases the risk? °The following risk factors make you more likely to develop preeclampsia: °· Being pregnant for the first time. °· Having had preeclampsia during a past pregnancy. °· Having a family history of preeclampsia. °· Having high blood pressure. °· Being pregnant with twins or triplets. °· Being 35 or older. °· Being African-American. °· Having kidney disease or diabetes. °·   Having medical conditions such as lupus or blood diseases. °· Being very overweight (obese). ° °What are the signs or symptoms? °The earliest signs of preeclampsia are: °· High blood pressure. °· Increased protein in your urine. Your health care provider will check for this at every visit before you give birth (prenatal visit). ° °Other symptoms that  may develop as the condition gets worse include: °· Severe headaches. °· Sudden weight gain. °· Swelling of the hands, face, legs, and feet. °· Nausea and vomiting. °· Vision problems, such as blurred or double vision. °· Numbness in the face, arms, legs, and feet. °· Urinating less than usual. °· Dizziness. °· Slurred speech. °· Abdominal pain, especially upper abdominal pain. °· Convulsions or seizures. ° °Symptoms generally go away after giving birth. °How is this diagnosed? °There are no screening tests for preeclampsia. Your health care provider will ask you about symptoms and check for signs of preeclampsia during your prenatal visits. You may also have tests that include: °· Urine tests. °· Blood tests. °· Checking your blood pressure. °· Monitoring your baby’s heart rate. °· Ultrasound. ° °How is this treated? °You and your health care provider will determine the treatment approach that is best for you. Treatment may include: °· Having more frequent prenatal exams to check for signs of preeclampsia, if you have an increased risk for preeclampsia. °· Bed rest. °· Reducing how much salt (sodium) you eat. °· Medicine to lower your blood pressure. °· Staying in the hospital, if your condition is severe. There, treatment will focus on controlling your blood pressure and the amount of fluids in your body (fluid retention). °· You may need to take medicine (magnesium sulfate) to prevent seizures. This medicine may be given as an injection or through an IV tube. °· Delivering your baby early, if your condition gets worse. You may have your labor started with medicine (induced), or you may have a cesarean delivery. ° °Follow these instructions at home: °Eating and drinking ° °· Drink enough fluid to keep your urine clear or pale yellow. °· Eat a healthy diet that is low in sodium. Do not add salt to your food. Check nutrition labels to see how much sodium a food or beverage contains. °· Avoid  caffeine. °Lifestyle °· Do not use any products that contain nicotine or tobacco, such as cigarettes and e-cigarettes. If you need help quitting, ask your health care provider. °· Do not use alcohol or drugs. °· Avoid stress as much as possible. Rest and get plenty of sleep. °General instructions °· Take over-the-counter and prescription medicines only as told by your health care provider. °· When lying down, lie on your side. This keeps pressure off of your baby. °· When sitting or lying down, raise (elevate) your feet. Try putting some pillows underneath your lower legs. °· Exercise regularly. Ask your health care provider what kinds of exercise are best for you. °· Keep all follow-up and prenatal visits as told by your health care provider. This is important. °How is this prevented? °To prevent preeclampsia or eclampsia from developing during another pregnancy: °· Get proper medical care during pregnancy. Your health care provider may be able to prevent preeclampsia or diagnose and treat it early. °· Your health care provider may have you take a low-dose aspirin or a calcium supplement during your next pregnancy. °· You may have tests of your blood pressure and kidney function after giving birth. °· Maintain a healthy weight. Ask your health care provider for   help managing weight gain during pregnancy. °· Work with your health care provider to manage any long-term (chronic) health conditions you have, such as diabetes or kidney problems. ° °Contact a health care provider if: °· You gain more weight than expected. °· You have headaches. °· You have nausea or vomiting. °· You have abdominal pain. °· You feel dizzy or light-headed. °Get help right away if: °· You develop sudden or severe swelling anywhere in your body. This usually happens in the legs. °· You gain 5 lbs (2.3 kg) or more during one week. °· You have severe: °? Abdominal pain. °? Headaches. °? Dizziness. °? Vision problems. °? Confusion. °? Nausea or  vomiting. °· You have a seizure. °· You have trouble moving any part of your body. °· You develop numbness in any part of your body. °· You have trouble speaking. °· You have any abnormal bleeding. °· You pass out. °This information is not intended to replace advice given to you by your health care provider. Make sure you discuss any questions you have with your health care provider. °Document Released: 07/26/2000 Document Revised: 03/26/2016 Document Reviewed: 03/04/2016 °Elsevier Interactive Patient Education © 2018 [employer] ° °

## 2017-05-24 NOTE — Addendum Note (Signed)
Encounter addended by: Lavina Hamman, MD on: 05/24/2017  2:35 PM<BR>    Actions taken: Sign clinical note

## 2017-05-24 NOTE — Progress Notes (Signed)
FHT from 10-12 reviewed.  Reactive NST, no regular ctx.

## 2017-06-16 ENCOUNTER — Encounter (HOSPITAL_COMMUNITY): Payer: Self-pay | Admitting: Anesthesiology

## 2017-06-16 ENCOUNTER — Encounter (HOSPITAL_COMMUNITY): Payer: Self-pay

## 2017-06-16 ENCOUNTER — Inpatient Hospital Stay (HOSPITAL_COMMUNITY): Payer: Medicaid Other | Admitting: Anesthesiology

## 2017-06-16 ENCOUNTER — Inpatient Hospital Stay (HOSPITAL_COMMUNITY)
Admission: AD | Admit: 2017-06-16 | Discharge: 2017-06-18 | DRG: 805 | Disposition: A | Discharge status: Home / Self Care | Payer: Medicaid Other | Source: Ambulatory Visit | Attending: Obstetrics and Gynecology | Admitting: Obstetrics and Gynecology

## 2017-06-16 DIAGNOSIS — O1404 Mild to moderate pre-eclampsia, complicating childbirth: Secondary | ICD-10-CM | POA: Diagnosis present

## 2017-06-16 DIAGNOSIS — O99334 Smoking (tobacco) complicating childbirth: Secondary | ICD-10-CM | POA: Diagnosis present

## 2017-06-16 DIAGNOSIS — O42913 Preterm premature rupture of membranes, unspecified as to length of time between rupture and onset of labor, third trimester: Secondary | ICD-10-CM | POA: Diagnosis present

## 2017-06-16 DIAGNOSIS — F17210 Nicotine dependence, cigarettes, uncomplicated: Secondary | ICD-10-CM | POA: Diagnosis present

## 2017-06-16 DIAGNOSIS — O99214 Obesity complicating childbirth: Secondary | ICD-10-CM | POA: Diagnosis present

## 2017-06-16 DIAGNOSIS — Z880 Allergy status to penicillin: Secondary | ICD-10-CM | POA: Diagnosis not present

## 2017-06-16 DIAGNOSIS — Z3A35 35 weeks gestation of pregnancy: Secondary | ICD-10-CM | POA: Diagnosis not present

## 2017-06-16 DIAGNOSIS — O35HXX0 Maternal care for other (suspected) fetal abnormality and damage, fetal lower extremities anomalies, not applicable or unspecified: Secondary | ICD-10-CM

## 2017-06-16 DIAGNOSIS — O42919 Preterm premature rupture of membranes, unspecified as to length of time between rupture and onset of labor, unspecified trimester: Secondary | ICD-10-CM | POA: Diagnosis present

## 2017-06-16 DIAGNOSIS — O358XX0 Maternal care for other (suspected) fetal abnormality and damage, not applicable or unspecified: Secondary | ICD-10-CM

## 2017-06-16 LAB — CBC
HCT: 37.4 % (ref 36.0–46.0)
HCT: 34.3 % — ABNORMAL LOW (ref 36.0–46.0)
HEMOGLOBIN: 11.8 g/dL — AB (ref 12.0–15.0)
Hemoglobin: 12.9 g/dL (ref 12.0–15.0)
MCH: 31.7 pg (ref 26.0–34.0)
MCH: 31.7 pg (ref 26.0–34.0)
MCHC: 34.5 g/dL (ref 30.0–36.0)
MCHC: 34.4 g/dL (ref 30.0–36.0)
MCV: 91.9 fL (ref 78.0–100.0)
MCV: 92.2 fL (ref 78.0–100.0)
PLATELETS: 254 10*3/uL (ref 150–400)
Platelets: 232 10*3/uL (ref 150–400)
RBC: 4.07 MIL/uL (ref 3.87–5.11)
RBC: 3.72 MIL/uL — AB (ref 3.87–5.11)
RDW: 12.9 % (ref 11.5–15.5)
RDW: 13.1 % (ref 11.5–15.5)
WBC: 13.1 10*3/uL — AB (ref 4.0–10.5)
WBC: 15.1 10*3/uL — ABNORMAL HIGH (ref 4.0–10.5)

## 2017-06-16 LAB — OB RESULTS CONSOLE GBS: STREP GROUP B AG: NEGATIVE

## 2017-06-16 LAB — ABO/RH: ABO/RH(D): A POS

## 2017-06-16 LAB — COMPREHENSIVE METABOLIC PANEL
ALT: 16 U/L (ref 14–54)
AST: 22 U/L (ref 15–41)
Albumin: 2.7 g/dL — ABNORMAL LOW (ref 3.5–5.0)
Alkaline Phosphatase: 173 U/L — ABNORMAL HIGH (ref 38–126)
Anion gap: 8 (ref 5–15)
BUN: <5 mg/dL — ABNORMAL LOW (ref 6–20)
CHLORIDE: 108 mmol/L (ref 101–111)
CO2: 21 mmol/L — ABNORMAL LOW (ref 22–32)
CREATININE: 0.5 mg/dL (ref 0.44–1.00)
Calcium: 8.6 mg/dL — ABNORMAL LOW (ref 8.9–10.3)
Glucose, Bld: 87 mg/dL (ref 65–99)
POTASSIUM: 3.9 mmol/L (ref 3.5–5.1)
Sodium: 137 mmol/L (ref 135–145)
TOTAL PROTEIN: 6.6 g/dL (ref 6.5–8.1)
Total Bilirubin: 0.3 mg/dL (ref 0.3–1.2)

## 2017-06-16 LAB — TYPE AND SCREEN
ABO/RH(D): A POS
ANTIBODY SCREEN: NEGATIVE

## 2017-06-16 MED ORDER — EPHEDRINE 5 MG/ML INJ
10.0000 mg | INTRAVENOUS | Status: DC | PRN
  Filled 2017-06-16: qty 2

## 2017-06-16 MED ORDER — PHENYLEPHRINE 40 MCG/ML (10ML) SYRINGE FOR IV PUSH (FOR BLOOD PRESSURE SUPPORT)
80.0000 ug | PREFILLED_SYRINGE | INTRAVENOUS | Status: DC | PRN
  Filled 2017-06-16: qty 5

## 2017-06-16 MED ORDER — OXYTOCIN BOLUS FROM INFUSION
500.0000 mL | Freq: Once | INTRAVENOUS | Status: AC
  Administered 2017-06-17: 500 mL via INTRAVENOUS

## 2017-06-16 MED ORDER — TERBUTALINE SULFATE 1 MG/ML IJ SOLN
0.2500 mg | Freq: Once | INTRAMUSCULAR | Status: DC | PRN
  Filled 2017-06-16: qty 1

## 2017-06-16 MED ORDER — FENTANYL 2.5 MCG/ML BUPIVACAINE 1/10 % EPIDURAL INFUSION (WH - ANES)
14.0000 mL/h | INTRAMUSCULAR | Status: DC | PRN
  Administered 2017-06-16 – 2017-06-17 (×3): 14 mL/h via EPIDURAL
  Filled 2017-06-16 (×3): qty 100

## 2017-06-16 MED ORDER — LACTATED RINGERS IV SOLN: 500.0000 mL | INTRAVENOUS | Status: DC | PRN

## 2017-06-16 MED ORDER — PHENYLEPHRINE 40 MCG/ML (10ML) SYRINGE FOR IV PUSH (FOR BLOOD PRESSURE SUPPORT)
80.0000 ug | PREFILLED_SYRINGE | INTRAVENOUS | Status: DC | PRN
  Filled 2017-06-16: qty 5
  Filled 2017-06-16: qty 10

## 2017-06-16 MED ORDER — OXYCODONE-ACETAMINOPHEN 5-325 MG PO TABS: 1.0000 | ORAL_TABLET | ORAL | Status: DC | PRN

## 2017-06-16 MED ORDER — DIPHENHYDRAMINE HCL 50 MG/ML IJ SOLN: 12.5000 mg | INTRAMUSCULAR | Status: DC | PRN

## 2017-06-16 MED ORDER — SOD CITRATE-CITRIC ACID 500-334 MG/5ML PO SOLN: 30.0000 mL | ORAL | Status: DC | PRN

## 2017-06-16 MED ORDER — ACETAMINOPHEN 325 MG PO TABS
650.0000 mg | ORAL_TABLET | ORAL | Status: DC | PRN
  Administered 2017-06-17: 650 mg via ORAL
  Filled 2017-06-16: qty 2

## 2017-06-16 MED ORDER — LIDOCAINE HCL (PF) 1 % IJ SOLN
30.0000 mL | INTRAMUSCULAR | Status: DC | PRN
  Filled 2017-06-16: qty 30

## 2017-06-16 MED ORDER — LIDOCAINE HCL (PF) 1 % IJ SOLN
INTRAMUSCULAR | Status: DC | PRN
  Administered 2017-06-16: 4 mL via EPIDURAL
  Administered 2017-06-16: 7 mL via EPIDURAL

## 2017-06-16 MED ORDER — OXYTOCIN 40 UNITS IN LACTATED RINGERS INFUSION - SIMPLE MED
1.0000 m[IU]/min | INTRAVENOUS | Status: DC
  Administered 2017-06-16 – 2017-06-17 (×2): 2 m[IU]/min via INTRAVENOUS
  Filled 2017-06-16 (×2): qty 1000

## 2017-06-16 MED ORDER — ONDANSETRON HCL 4 MG/2ML IJ SOLN
4.0000 mg | Freq: Four times a day (QID) | INTRAMUSCULAR | Status: DC | PRN
  Administered 2017-06-16: 4 mg via INTRAVENOUS
  Filled 2017-06-16: qty 2

## 2017-06-16 MED ORDER — OXYTOCIN 40 UNITS IN LACTATED RINGERS INFUSION - SIMPLE MED: 2.5000 [IU]/h | INTRAVENOUS | Status: DC

## 2017-06-16 MED ORDER — LACTATED RINGERS IV SOLN: 500.0000 mL | Freq: Once | INTRAVENOUS | Status: DC

## 2017-06-16 MED ORDER — OXYCODONE-ACETAMINOPHEN 5-325 MG PO TABS: 2.0000 | ORAL_TABLET | ORAL | Status: DC | PRN

## 2017-06-16 MED ORDER — BUTORPHANOL TARTRATE 1 MG/ML IJ SOLN: 1.0000 mg | INTRAMUSCULAR | Status: DC | PRN

## 2017-06-16 MED ORDER — LACTATED RINGERS IV SOLN
INTRAVENOUS | Status: DC
  Administered 2017-06-16: 23:00:00 via INTRAVENOUS
  Administered 2017-06-16 (×2): 125 mL/h via INTRAVENOUS
  Administered 2017-06-17: 13:00:00 via INTRAVENOUS

## 2017-06-16 NOTE — H&P (Signed)
Sharon [name] is a 21 y.o. female, G1P0, EGA 35+ weeks with EDC 12-6 presenting for PPROM.  She had an NST in the office this am for PIH, left and came back when had a gush of fluid.  Eval in the office with + nitrazine and fern.  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  OB History    Gravida Para Term Preterm AB Living   1             SAB TAB Ectopic Multiple Live Births                 Past Medical History:  Diagnosis Date  . Headache   . Medical history non-contributory   . UTI (urinary tract infection)    Past Surgical History:  Procedure Laterality Date  . NO PAST SURGERIES    . TOOTH EXTRACTION     Family History: family history includes Diabetes in her maternal grandmother. Social History:  reports that she has been smoking cigarettes.  She has been smoking about 0.25 packs per day. she has never used smokeless tobacco. She reports that she does not drink alcohol or use drugs.     Maternal Diabetes: No Genetic Screening: Normal Maternal Ultrasounds/Referrals: Abnormal:  Findings:   Other: Fetal Ultrasounds or other Referrals:  None Maternal Substance Abuse:  No Significant Maternal Medications:  None Significant Maternal Lab Results:  Lab values include: Group B Strep negative Other Comments:  right club foot  Review of Systems  Respiratory: Negative.   Cardiovascular: Negative.    Maternal Medical History:  Reason for admission: Rupture of membranes.   Contractions: Frequency: irregular.   Perceived severity is mild.    Fetal activity: Perceived fetal activity is normal.    Prenatal complications: PIH.   Prenatal Complications - Diabetes: none.    Dilation: 3 Effacement (%): 70, 80 Station: -2 Exam by:: Sharon HallJenny Middleton RN Blood pressure (!) 148/96, pulse (!) 104, temperature 99 F (37.2 C), temperature source Oral, resp. rate 16, height 5\' 2"   (1.575 m), weight 234 lb (106.1 kg), last menstrual period 10/10/2016. Maternal Exam:  Uterine Assessment: Contraction strength is mild.  Contraction frequency is irregular.   Abdomen: Patient reports no abdominal tenderness. Estimated fetal weight is 6 lbs.   Fetal presentation: vertex  Introitus: Normal vulva. Normal vagina.  Ferning test: positive.  Nitrazine test: positive. Amniotic fluid character: clear.  Pelvis: adequate for delivery.   Cervix: Cervix evaluated by digital exam.     Fetal Exam Fetal Monitor Review: Mode: ultrasound.   Baseline rate: 130.  Variability: moderate (6-25 bpm).   Pattern: accelerations present and no decelerations.    Fetal State Assessment: Category I - tracings are normal.     Physical Exam  Vitals reviewed. Constitutional: She appears well-developed and well-nourished.  Cardiovascular: Normal rate and regular rhythm.  Respiratory: Effort normal. No respiratory distress.  GI: Soft.    Prenatal labs: ABO, Rh: --/--/A POS, A POS (11/05 1219) Antibody: NEG (11/05 1219) Rubella:  Immune RPR: Nonreactive (09/12 0000)  HBsAg:   Neg HIV: Non-reactive (09/12 0000)  GBS:   Neg  Assessment/Plan: IUP at 35+ weeks with PPROM, PIH, fetal club foot.  BP stable so far, labs normal.  Will augment ctx with pitocin, monitor progress and BP, anticipate SVD.   Leighton Roachodd D Marlo Goodrich 06/16/2017, 4:57 PM

## 2017-06-16 NOTE — Anesthesia Pain Management Evaluation Note (Signed)
  CRNA Pain Management Visit Note  Patient: Sharon [name], 21 y.o., female  "Hello [facility] am a [facility]. We have an anesthesia team available at all times to provide care throughout the hospital, including epidural management and anesthesia for C-section. I don't know your plan for the delivery whether it a natural birth, water birth, IV sedation, nitrous supplementation, doula or epidural, but we want to meet your pain goals."   1.Was your pain managed to your expectations on prior hospitalizations?   No prior hospitalizations  2.What is your expectation for pain management during this hospitalization?     Epidural  3.How can we help you reach that goal? Maintain epidural.  Record the patient's initial score and the patient's pain goal.   Pain: 0  Pain Goal: 3 [facility] wants you to be able to say your pain was always managed very well.  Sharon [name] 06/16/2017

## 2017-06-16 NOTE — Anesthesia Procedure Notes (Signed)
Epidural Patient location during procedure: OB Start time: 06/16/2017 8:15 PM End time: 06/16/2017 8:19 PM  Staffing Anesthesiologist: Beryle LatheBrock, Tanuj Mullens E, MD Performed: anesthesiologist   Preanesthetic Checklist Completed: patient identified, pre-op evaluation, timeout performed, IV checked, risks and benefits discussed and monitors and equipment checked  Epidural Patient position: sitting Prep: DuraPrep Patient monitoring: continuous pulse ox and blood pressure Approach: midline Location: L3-L4 Injection technique: LOR saline  Needle:  Needle type: Tuohy  Needle gauge: 17 G Needle length: 9 cm Needle insertion depth: 8 cm Catheter size: 19 Gauge Catheter at skin depth: 14 cm Test dose: negative and Other (1% lidocaine)  Additional Notes Patient identified. Risks including, but not limited to, bleeding, infection, nerve damage, paralysis, inadequate analgesia, blood pressure changes, nausea, vomiting, allergic reaction, postpartum back pain, itching, and headache were discussed. Patient expressed understanding and wished to proceed. Sterile prep and drape, including hand hygiene, mask, and sterile gloves were used. The patient was positioned and the spine was prepped. The skin was anesthetized with lidocaine. No paraesthesia or other complication noted. The patient did not experience any signs of intravascular injection such as tinnitus or metallic taste in mouth, nor signs of intrathecal spread such as rapid motor block. Please see nursing notes for vital signs. The patient tolerated the procedure well.   Leslye Peerhomas Fujiko Picazo, MDReason for block:procedure for pain

## 2017-06-16 NOTE — Anesthesia Preprocedure Evaluation (Signed)
Anesthesia Evaluation  Patient identified by MRN, date of birth, ID band Patient awake    Reviewed: Allergy & Precautions, NPO status , Patient's Chart, lab work & pertinent test results  Airway Mallampati: II  TM Distance: >3 FB Neck ROM: Full    Dental  (+) Dental Advisory Given   Pulmonary Current Smoker,    Pulmonary exam normal breath sounds clear to auscultation       Cardiovascular negative cardio ROS Normal cardiovascular exam Rhythm:Regular Rate:Normal     Neuro/Psych  Headaches, negative psych ROS   GI/Hepatic negative GI ROS, Neg liver ROS,   Endo/Other  Morbid obesity  Renal/GU negative Renal ROS  negative genitourinary   Musculoskeletal negative musculoskeletal ROS (+)   Abdominal   Peds  Hematology negative hematology ROS (+)   Anesthesia Other Findings   Reproductive/Obstetrics (+) Pregnancy Pre-eclampsia                             Anesthesia Physical Anesthesia Plan  ASA: III  Anesthesia Plan: Epidural   Post-op Pain Management:    Induction:   PONV Risk Score and Plan: 1  Airway Management Planned: Natural Airway  Additional Equipment:   Intra-op Plan:   Post-operative Plan:   Informed Consent: I have reviewed the patients History and Physical, chart, labs and discussed the procedure including the risks, benefits and alternatives for the proposed anesthesia with the patient or authorized representative who has indicated his/her understanding and acceptance.     Plan Discussed with:   Anesthesia Plan Comments: (Labs reviewed. Platelets acceptable, patient not taking any blood thinning medications. Risks and benefits discussed with patient, patient expressed understanding and wished to proceed.)        Anesthesia Quick Evaluation

## 2017-06-17 ENCOUNTER — Encounter (HOSPITAL_COMMUNITY): Payer: Self-pay | Admitting: General Practice

## 2017-06-17 LAB — PROTEIN / CREATININE RATIO, URINE
CREATININE, URINE: 161 mg/dL
Protein Creatinine Ratio: 0.63 mg/mg{Cre} — ABNORMAL HIGH (ref 0.00–0.15)
TOTAL PROTEIN, URINE: 101 mg/dL

## 2017-06-17 LAB — OB RESULTS CONSOLE RPR: RPR: NONREACTIVE

## 2017-06-17 LAB — RPR: RPR Ser Ql: NONREACTIVE

## 2017-06-17 MED ORDER — ACETAMINOPHEN 325 MG PO TABS
650.0000 mg | ORAL_TABLET | ORAL | Status: DC | PRN
  Administered 2017-06-17: 650 mg via ORAL
  Filled 2017-06-17 (×2): qty 2

## 2017-06-17 MED ORDER — WITCH HAZEL-GLYCERIN EX PADS: 1.0000 "application " | MEDICATED_PAD | CUTANEOUS | Status: DC | PRN

## 2017-06-17 MED ORDER — IBUPROFEN 600 MG PO TABS
600.0000 mg | ORAL_TABLET | Freq: Four times a day (QID) | ORAL | Status: DC
  Administered 2017-06-17 – 2017-06-19 (×6): 600 mg via ORAL
  Filled 2017-06-17 (×7): qty 1

## 2017-06-17 MED ORDER — OXYCODONE HCL 5 MG PO TABS: 5.0000 mg | ORAL_TABLET | ORAL | Status: DC | PRN

## 2017-06-17 MED ORDER — ONDANSETRON HCL 4 MG PO TABS: 4.0000 mg | ORAL_TABLET | ORAL | Status: DC | PRN

## 2017-06-17 MED ORDER — PRENATAL MULTIVITAMIN CH
1.0000 | ORAL_TABLET | Freq: Every day | ORAL | Status: DC
  Administered 2017-06-18 – 2017-06-19 (×2): 1 via ORAL
  Filled 2017-06-17 (×2): qty 1

## 2017-06-17 MED ORDER — DIPHENHYDRAMINE HCL 25 MG PO CAPS: 25.0000 mg | ORAL_CAPSULE | Freq: Four times a day (QID) | ORAL | Status: DC | PRN

## 2017-06-17 MED ORDER — HYDRALAZINE HCL 20 MG/ML IJ SOLN: 10.0000 mg | Freq: Once | INTRAMUSCULAR | Status: DC | PRN

## 2017-06-17 MED ORDER — ONDANSETRON HCL 4 MG/2ML IJ SOLN: 4.0000 mg | INTRAMUSCULAR | Status: DC | PRN

## 2017-06-17 MED ORDER — OXYCODONE HCL 5 MG PO TABS: 10.0000 mg | ORAL_TABLET | ORAL | Status: DC | PRN

## 2017-06-17 MED ORDER — ZOLPIDEM TARTRATE 5 MG PO TABS: 5.0000 mg | ORAL_TABLET | Freq: Every evening | ORAL | Status: DC | PRN

## 2017-06-17 MED ORDER — TETANUS-DIPHTH-ACELL PERTUSSIS 5-2.5-18.5 LF-MCG/0.5 IM SUSP: 0.5000 mL | Freq: Once | INTRAMUSCULAR | Status: DC

## 2017-06-17 MED ORDER — BENZOCAINE-MENTHOL 20-0.5 % EX AERO
1.0000 "application " | INHALATION_SPRAY | CUTANEOUS | Status: DC | PRN
  Administered 2017-06-17: 1 via TOPICAL
  Filled 2017-06-17: qty 56

## 2017-06-17 MED ORDER — SIMETHICONE 80 MG PO CHEW: 80.0000 mg | CHEWABLE_TABLET | ORAL | Status: DC | PRN

## 2017-06-17 MED ORDER — DIBUCAINE 1 % RE OINT: 1.0000 "application " | TOPICAL_OINTMENT | RECTAL | Status: DC | PRN

## 2017-06-17 MED ORDER — LABETALOL HCL 5 MG/ML IV SOLN: 20.0000 mg | INTRAVENOUS | Status: DC | PRN

## 2017-06-17 MED ORDER — COCONUT OIL OIL
1.0000 "application " | TOPICAL_OIL | Status: DC | PRN
  Filled 2017-06-17: qty 120

## 2017-06-17 MED ORDER — SENNOSIDES-DOCUSATE SODIUM 8.6-50 MG PO TABS
2.0000 | ORAL_TABLET | ORAL | Status: DC
  Administered 2017-06-17: 2 via ORAL
  Filled 2017-06-17: qty 2

## 2017-06-17 NOTE — Progress Notes (Signed)
Patient ID: Barbaraann ShareFaith Penninger, female   DOB: 12/12/1995, 21 y.o.   MRN: [id number] Pt was on pitocin up to a maximum of 22mu with no significant cervical change. IUPC did not show contractions.   Pitocin was discontinued for a rest.  Pt afeb and vss, occasional elevated BP  To 130/90 but for most part WNL  cervix   4/70/-1 AROM forebag and IUPC replaced  Will begin pitocin back and see if new IUPC can trace better.  Follow progress

## 2017-06-17 NOTE — Progress Notes (Signed)
Patient ID: Sharon [name], female   DOB: 1996/01/03, 21 y.o.   MRN: [id number] Pt feeling pressure on her left leg and side  BP 150/90's FHR category 1  Cervix c/8-9/0, +1  OP   IUPC replaced again, none seem to be working  Will continue to follow progress, expect to begin pushing in next 1-2 hours or sooner.

## 2017-06-17 NOTE — Progress Notes (Signed)
Comfortable with epidural Afeb, VSS, BP mostly normal FHT- Cat I, ctx not tracing well on 22 units pitocin VE-3/90/-1, vtx, IUPC placed Most likely in latent labor, will continue pitocin and monitor progress

## 2017-06-17 NOTE — Progress Notes (Signed)
Patient ID: Sharon [name], female   DOB: 06/17/1996, 21 y.o.   MRN: [id number] Prot:creat ration returned slightly elevated at 0.63 Pt BP  140-150/80-90's since delivery, will start IV labetalol protocol and repeat labs in AM. If needs labetalol to control BP overnight, convert to po in AM

## 2017-06-17 NOTE — Progress Notes (Signed)
Dr Senaida Oresichardson notified of BP 154/91 & 143/89 upon admission to mother/baby unit.  New orders given.

## 2017-06-18 LAB — CBC
HCT: 30.1 % — ABNORMAL LOW (ref 36.0–46.0)
HEMOGLOBIN: 10.2 g/dL — AB (ref 12.0–15.0)
MCH: 31.1 pg (ref 26.0–34.0)
MCHC: 33.9 g/dL (ref 30.0–36.0)
MCV: 91.8 fL (ref 78.0–100.0)
Platelets: 199 10*3/uL (ref 150–400)
RBC: 3.28 MIL/uL — ABNORMAL LOW (ref 3.87–5.11)
RDW: 13.1 % (ref 11.5–15.5)
WBC: 13.9 10*3/uL — ABNORMAL HIGH (ref 4.0–10.5)

## 2017-06-18 LAB — COMPREHENSIVE METABOLIC PANEL
ALBUMIN: 2.2 g/dL — AB (ref 3.5–5.0)
ALK PHOS: 124 U/L (ref 38–126)
ALT: 17 U/L (ref 14–54)
ANION GAP: 8 (ref 5–15)
AST: 25 U/L (ref 15–41)
BUN: 5 mg/dL — ABNORMAL LOW (ref 6–20)
CALCIUM: 8.2 mg/dL — AB (ref 8.9–10.3)
CO2: 23 mmol/L (ref 22–32)
Chloride: 105 mmol/L (ref 101–111)
Creatinine, Ser: 0.59 mg/dL (ref 0.44–1.00)
GFR calc non Af Amer: >60 mL/min (ref >60)
Glucose, Bld: 86 mg/dL (ref 65–99)
POTASSIUM: 3.4 mmol/L — AB (ref 3.5–5.1)
SODIUM: 136 mmol/L (ref 135–145)
TOTAL PROTEIN: 5.7 g/dL — AB (ref 6.5–8.1)
Total Bilirubin: 0.4 mg/dL (ref 0.3–1.2)

## 2017-06-18 NOTE — Progress Notes (Signed)
Post Partum Day 1 Subjective: no complaints, up ad lib, voiding, tolerating PO and nl lochia, pain controlled.  Closely monitor BP, was mildly elevated yesterday.  Objective: Blood pressure 138/81, pulse 72, temperature 98 F (36.7 C), temperature source Oral, resp. rate 18, height 5\' 2"  (1.575 m), weight 106.1 kg (234 lb), last menstrual period 10/10/2016, SpO2 98 %, unknown if currently breastfeeding.  Physical Exam:  General: alert and no distress Lochia: appropriate Uterine Fundus: firm  Recent Labs    06/16/17 1937 06/18/17 0459  HGB 11.8* 10.2*  HCT 34.3* 30.1*    Assessment/Plan: Plan for discharge tomorrow, Breastfeeding and Lactation consult.  Routine PP care.     LOS: 2 days   Sharon [name] 06/18/2017, 8:07 AM

## 2017-06-18 NOTE — Anesthesia Postprocedure Evaluation (Signed)
Anesthesia Post Note  Patient: Sharon [name]  Procedure(s) Performed: AN AD HOC LABOR EPIDURAL     Patient location during evaluation: Mother Baby Anesthesia Type: Epidural Level of consciousness: awake Pain management: pain level controlled Vital Signs Assessment: post-procedure vital signs reviewed and stable Respiratory status: spontaneous breathing Cardiovascular status: stable Postop Assessment: no headache, epidural receding and patient able to bend at knees Anesthetic complications: no    Last Vitals:  Vitals:   06/18/17 0622 06/18/17 0730  BP: (!) 110/57 138/81  Pulse: 70 72  Resp: 18 18  Temp: 36.7 C 36.7 C  SpO2:  98%    Last Pain:  Vitals:   06/18/17 0730  TempSrc: Oral  PainSc:    Pain Goal: Patients Stated Pain Goal: 2 (06/17/17 1900)               Edison PaceWILKERSON,Reaghan Kawa

## 2017-06-18 NOTE — Lactation Note (Signed)
This note was copied from a baby's chart. Lactation Consultation Note Mom stated baby is spitting up mucous. RN gave 12 ml colostrum that RN expressed. RN set up DEBP. Encouraged mom to post-pump d/t LPI. explained prob. Wouldn't collect anything, mainly for stimulation. If mom would get anything, give as supplement. Discussed supplementing after BF.  Will see mom and baby again w/interested in BF. Swaddled baby, discussed keeping baby warm, hat on head. LPI information sheet left in rm. briefly highlighted. Mom going out to smoke.  Patient Name: Sharon [name] FAOZH'YToday's Date: 06/18/2017 Reason for consult: Initial assessment;Late-preterm 34-36.6wks   Maternal Data    Feeding Feeding Type: Breast Milk Length of feed: 0 min  LATCH Score Latch: Too sleepy or reluctant, no latch achieved, no sucking elicited.                 Interventions    Lactation Tools Discussed/Used Tools: Pump;Coconut oil Breast pump type: Double-Electric Breast Pump   Consult Status Consult Status: Follow-up Date: 06/18/17 Follow-up type: In-patient    Charyl DancerCARVER, Alfreddie Consalvo G 06/18/2017, 1:47 AM

## 2017-06-19 MED ORDER — IBUPROFEN 600 MG PO TABS: 600.0000 mg | ORAL_TABLET | Freq: Four times a day (QID) | ORAL | 1 refills | Status: DC | PRN

## 2017-06-19 NOTE — Lactation Note (Signed)
This note was copied from a baby's chart. Lactation Consultation Note:  Assist mother to chair and positioned with good pillow support. Mother taught cross cradle hold and how to firm nipple. Mother taught to latch infant on with an off sided latch. Infant opened mouth wide and mother reports she feel no pain. Mother taught breast compression. Observed infant with rhythmic suckling pattern and a few swallow. Infant sustained latch for 5 mins.   Infant was given 5ml of ebm at the breast with a curved tip syringe. Mother was also taught to finger feed infant. Infant offered the alternate breast in football hold. Infant refused latch. Recommend that mother pace bottle feed infant . LC bottle fed and taught mother paced bottle feeding. Infant took 15ml of neosure.  Mother to post pump for 15 mins.  Advised mother to page for latch assist [facility] or staff nurse to observe next feeding. Mother to have staff nurse to assist with bottle feeding if needed. [facility]. Breastfeed, supplement infant with ebm and formula then post pump.   Patient Name: Sharon [name] KGMWN'UToday's Date: 06/19/2017 Reason for consult: Follow-up assessment   Maternal Data    Feeding Feeding Type: Formula Length of feed: (no sustained latch)  LATCH Score Latch: Grasps breast easily, tongue down, lips flanged, rhythmical sucking.  Audible Swallowing: A few with stimulation(observed swallows with breast compression)  Type of Nipple: Everted at rest and after stimulation  Comfort (Breast/Nipple): Filling, red/small blisters or bruises, mild/mod discomfort(nipples pink)  Hold (Positioning): Assistance needed to correctly position infant at breast and maintain latch.  LATCH Score: 7  Interventions Interventions: Breast feeding basics reviewed;Hand express;DEBP  Lactation Tools Discussed/Used     Consult Status Consult Status: Follow-up Date: 06/20/17 Follow-up type:  In-patient    [name], Douglass Duns[place] [facility] 06/19/2017, 11:10 AM

## 2017-06-19 NOTE — Lactation Note (Signed)
This note was copied from a baby's chart. Lactation Consultation Note: assist mother with pumping. Mother using #24 flanges for a good fit. Mother has soreness but from poor latching.  Infant is in Nursery. Mother to page when infant returns to room for feeding.   Patient Name: Sharon [name] Reason for consult: Follow-up assessment   Maternal Data    Feeding Feeding Type: Breast Milk Length of feed: 20 min  LATCH Score                   Interventions Interventions: Breast feeding basics reviewed;Hand express;DEBP  Lactation Tools Discussed/Used     Consult Status Consult Status: Follow-up Date: ([facility] with next feeding) Follow-up type: In-patient    [name], [name] [facility] [name], 10:10 AM

## 2017-06-19 NOTE — Lactation Note (Signed)
This note was copied from a baby's chart. Lactation Consultation Note: Mother reports that infant fed for 20 mins the last feeding and infant was given 2 ml of ebm with a spoon. Lots more teaching with Mother on late preterm infants. Reviewed LPI sheet and supplemental guidelines. Mother advised to breastfeed infant , supplement with ebm/formula and then post pump. Mother reports that her friend is giving her an electric pump. Mother is not active with WIC at this time. [facility]  Mother was assisted with hand expression and obtained 1 ml in syringe. Mother to page for assistance when infant returns from nursery.  Patient Name: Sharon [name]: 06/19/2017 Reason for consult: Follow-up assessment   Maternal Data    Feeding Feeding Type: Breast Milk Length of feed: 20 min  LATCH Score                   Interventions Interventions: Breast feeding basics reviewed;Hand express;DEBP  Lactation Tools Discussed/Used     Consult Status Consult Status: Follow-up [name]: ([facility]) Follow-up type: In-patient    [name], Kaesyn Johnston [facility] 06/19/2017, 10:06 AM

## 2017-06-19 NOTE — Discharge Summary (Signed)
OB Discharge Summary     Patient Name: Sharon [name] DOB: 1995/11/29 MRN: [id number]  Date of admission: 06/16/2017 Delivering MD: Huel Cote   Date of discharge: 06/19/2017  Admitting diagnosis: 35WKS, WATER BROKE Intrauterine pregnancy: [redacted]w[redacted]d     Secondary diagnosis:  Active Problems:   Preterm premature rupture of membranes (PPROM) with unknown onset of labor   NSVD (normal spontaneous vaginal delivery)  Additional problems: PIH - stable     Discharge diagnosis: Term Pregnancy Delivered and Preeclampsia (mild)                                                                                                Post partum procedures:none  Augmentation: Pitocin  Complications: None  Hospital course:  Onset of Labor With Vaginal Delivery     21 y.o. yo G1P0101 at [redacted]w[redacted]d was admitted in Latent Labor on 06/16/2017. Patient had an uncomplicated labor course as follows:  Membrane Rupture Time/Date: 10:30 AM ,06/16/2017   Intrapartum Procedures: Episiotomy: None [1]                                         Lacerations:  Labial [10]  Patient had a delivery of a Viable infant. 06/17/2017  Information for the patient's newborn:  Annalyce, Lanpher [[id number]]  Delivery Method: Vaginal, Spontaneous(Filed from Delivery Summary)    Pateint had an uncomplicated postpartum course.  She is ambulating, tolerating a regular diet, passing flatus, and urinating well. Patient is discharged home in stable condition on 06/19/17.   Physical exam  Vitals:   06/18/17 0622 06/18/17 0730 06/18/17 1831 06/19/17 0648  BP: (!) 110/57 138/81 (!) 134/91 129/66  Pulse: 70 72 (!) 102 82  Resp: Temp: 98 F (36.7 C) 98 F (36.7 C) 97.7 F (36.5 C) 98.3 F (36.8 C)  TempSrc: Oral Oral Oral   SpO2:  98%    Weight:      Height:       General: alert, cooperative and no distress Lochia: appropriate Uterine Fundus: firm Incision: N/a DVT Evaluation: Negative Homan's sign. Labs: Lab  Results  Component Value Date   WBC 13.9 (H) 06/18/2017   HGB 10.2 (L) 06/18/2017   HCT 30.1 (L) 06/18/2017   MCV 91.8 06/18/2017   PLT 199 06/18/2017   CMP Latest Ref Rng & Units 06/18/2017  Glucose 65 - 99 mg/dL 86  BUN 6 - 20 mg/dL 5(L)  Creatinine 7.82 - 1.00 mg/dL 9.56  Sodium 213 - 086 mmol/L 136  Potassium 3.5 - 5.1 mmol/L 3.4(L)  Chloride 101 - 111 mmol/L 105  CO2 22 - 32 mmol/L 23  Calcium 8.9 - 10.3 mg/dL 8.2(L)  Total Protein 6.5 - 8.1 g/dL 5.7(Q)  Total Bilirubin 0.3 - 1.2 mg/dL 0.4  Alkaline Phos 38 - 126 U/L 124  AST 15 - 41 U/L 25  ALT 14 - 54 U/L 17    Discharge instruction: per After Visit Summary and "Baby and Me Booklet".  After visit  meds:  Allergies as of 06/19/2017      Reactions   Amoxil [amoxicillin] Hives   Can take Keflex   Penicillins Hives   Has patient had a PCN reaction causing immediate rash, facial/tongue/throat swelling, SOB or lightheadedness with hypotension: Yes Has patient had a PCN reaction causing severe rash involving mucus membranes or skin necrosis: No Has patient had a PCN reaction that required hospitalization: No Has patient had a PCN reaction occurring within the last 10 years: No If all of the above answers are "NO", then may proceed with Cephalosporin use.      Medication List    TAKE these medications   acetaminophen 500 MG tablet Commonly known as:  TYLENOL Take 1,000 mg every 6 (six) hours as needed by mouth for mild pain or headache.   ibuprofen 600 MG tablet Commonly known as:  ADVIL,MOTRIN Take 1 tablet (600 mg total) every 6 (six) hours as needed by mouth.   PRENATAL 1 PO Take 1 tablet daily by mouth.       Diet: low salt diet  Activity: Advance as tolerated. Pelvic rest for 6 weeks.   Outpatient follow up:Follow up in office in 3-4days for BP check; circ in office; 3 week phone call and 6week pp visit Follow up Appt:No future appointments. Follow up Visit:No Follow-up on file.  Postpartum  contraception: Not Discussed  Newborn Data: Live born female  Birth Weight: 6 lb 4.5 oz (2850 g) APGAR: 8, 9  Newborn Delivery   Birth date/time:  06/17/2017 16:00:00 Delivery type:  Vaginal, Spontaneous     Baby Feeding: Breast Disposition:rooming in   06/19/2017 Cathrine Musterecilia W Nerida Boivin, DO

## 2017-06-19 NOTE — Progress Notes (Signed)
Patient ID: Sharon [name], female   DOB: 12/17/1995, 21 y.o.   MRN: [id number] Pt doing well. Pain controlled with meds. No HAs, blurry vision. Lochia mild. Tolerating PO well. Voiding with no issues. She reports some difficulty with breastfeeding - working with lactation. Plans to room in as baby needs phototherapy; bonding well VSS 129/66 - no meds ABD - FF and below umb EXT - no homans  A/P: PPD#2 s/p svd - stable         Discharge instructions reviewed - f/u in office in 3-4days for BP check; circ in office and 6week pp visit

## 2017-06-19 NOTE — Discharge Instructions (Signed)
Nothing in vagina for 6 weeks.  No sex, tampons, and douching.  Other instructions as in [facility] Discharge Booklet. °

## 2017-06-20 ENCOUNTER — Inpatient Hospital Stay (HOSPITAL_COMMUNITY): Payer: Medicaid Other

## 2017-06-20 ENCOUNTER — Inpatient Hospital Stay (HOSPITAL_COMMUNITY)
Admission: AD | Admit: 2017-06-20 | Discharge: 2017-06-20 | Disposition: A | Discharge status: Home / Self Care | Payer: Medicaid Other | Source: Ambulatory Visit | Attending: Obstetrics and Gynecology | Admitting: Obstetrics and Gynecology

## 2017-06-20 ENCOUNTER — Ambulatory Visit: Payer: Self-pay

## 2017-06-20 DIAGNOSIS — F17210 Nicotine dependence, cigarettes, uncomplicated: Secondary | ICD-10-CM | POA: Insufficient documentation

## 2017-06-20 DIAGNOSIS — O99335 Smoking (tobacco) complicating the puerperium: Secondary | ICD-10-CM | POA: Diagnosis not present

## 2017-06-20 DIAGNOSIS — O165 Unspecified maternal hypertension, complicating the puerperium: Secondary | ICD-10-CM | POA: Diagnosis not present

## 2017-06-20 DIAGNOSIS — Z880 Allergy status to penicillin: Secondary | ICD-10-CM | POA: Insufficient documentation

## 2017-06-20 LAB — COMPREHENSIVE METABOLIC PANEL
ALT: 16 U/L (ref 14–54)
ANION GAP: 8 (ref 5–15)
AST: 21 U/L (ref 15–41)
Albumin: 2.6 g/dL — ABNORMAL LOW (ref 3.5–5.0)
Alkaline Phosphatase: 103 U/L (ref 38–126)
BUN: 8 mg/dL (ref 6–20)
CHLORIDE: 107 mmol/L (ref 101–111)
CO2: 25 mmol/L (ref 22–32)
Calcium: 8.3 mg/dL — ABNORMAL LOW (ref 8.9–10.3)
Creatinine, Ser: 0.59 mg/dL (ref 0.44–1.00)
Glucose, Bld: 101 mg/dL — ABNORMAL HIGH (ref 65–99)
POTASSIUM: 3.6 mmol/L (ref 3.5–5.1)
Sodium: 140 mmol/L (ref 135–145)
Total Bilirubin: 0.2 mg/dL — ABNORMAL LOW (ref 0.3–1.2)
Total Protein: 6.2 g/dL — ABNORMAL LOW (ref 6.5–8.1)

## 2017-06-20 LAB — URINALYSIS, ROUTINE W REFLEX MICROSCOPIC
BILIRUBIN URINE: NEGATIVE
Glucose, UA: NEGATIVE mg/dL
Ketones, ur: NEGATIVE mg/dL
LEUKOCYTES UA: NEGATIVE
NITRITE: NEGATIVE
PH: 6 (ref 5.0–8.0)
Protein, ur: NEGATIVE mg/dL
SPECIFIC GRAVITY, URINE: 1.01 (ref 1.005–1.030)

## 2017-06-20 LAB — PROTEIN / CREATININE RATIO, URINE
Creatinine, Urine: 62 mg/dL
Protein Creatinine Ratio: 0.1 mg/mg{Cre} (ref 0.00–0.15)
TOTAL PROTEIN, URINE: 6 mg/dL

## 2017-06-20 LAB — CBC
HCT: 30.4 % — ABNORMAL LOW (ref 36.0–46.0)
Hemoglobin: 10.4 g/dL — ABNORMAL LOW (ref 12.0–15.0)
MCH: 31.4 pg (ref 26.0–34.0)
MCHC: 34.2 g/dL (ref 30.0–36.0)
MCV: 91.8 fL (ref 78.0–100.0)
PLATELETS: 238 10*3/uL (ref 150–400)
RBC: 3.31 MIL/uL — ABNORMAL LOW (ref 3.87–5.11)
RDW: 13.2 % (ref 11.5–15.5)
WBC: 10.9 10*3/uL — AB (ref 4.0–10.5)

## 2017-06-20 MED ORDER — METHYLERGONOVINE MALEATE 0.2 MG PO TABS
0.2000 mg | ORAL_TABLET | Freq: Once | ORAL | Status: AC
  Administered 2017-06-20: 0.2 mg via ORAL
  Filled 2017-06-20: qty 1

## 2017-06-20 MED ORDER — METHYLERGONOVINE MALEATE 0.2 MG PO TABS: 0.2000 mg | ORAL_TABLET | Freq: Three times a day (TID) | ORAL | 0 refills | Status: DC

## 2017-06-20 NOTE — Discharge Instructions (Signed)
Postpartum Hypertension °Postpartum hypertension is high blood pressure after pregnancy that remains higher than normal for more than two days after delivery. You may not realize that you have postpartum hypertension if your blood pressure is not being checked regularly. In some cases, postpartum hypertension will go away on its own, usually within a week of delivery. However, for some women, medical treatment is required to prevent serious complications, such as seizures or stroke. °The following things can affect your blood pressure: °· The type of delivery you had. °· Having received IV fluids or other medicines during or after delivery. ° °What are the causes? °Postpartum hypertension may be caused by any of the following or by a combination of any of the following: °· Hypertension that existed before pregnancy (chronic hypertension). °· Gestational hypertension. °· Preeclampsia or eclampsia. °· Receiving a lot of fluid through an IV during or after delivery. °· Medicines. °· HELLP syndrome. °· Hyperthyroidism. °· Stroke. °· Other rare neurological or blood disorders. ° °In some cases, the cause may not be known. °What increases the risk? °Postpartum hypertension can be related to one or more risk factors, such as: °· Chronic hypertension. In some cases, this may not have been diagnosed before pregnancy. °· Obesity. °· Type 2 diabetes. °· Kidney disease. °· Family history of preeclampsia. °· Other medical conditions that cause hormonal imbalances. ° °What are the signs or symptoms? °As with all types of hypertension, postpartum hypertension may not have any symptoms. Depending on how high your blood pressure is, you may experience: °· Headaches. These may be mild, moderate, or severe. They may also be steady, constant, or sudden in onset (thunderclap headache). °· Visual changes. °· Dizziness. °· Shortness of breath. °· Swelling of your hands, feet, lower legs, or face. In some cases, you may have swelling in  more than one of these locations. °· Heart palpitations or a racing heartbeat. °· Difficulty breathing while lying down. °· Decreased urination. ° °Other rare signs and symptoms may include: °· Sweating more than usual. This lasts longer than a few days after delivery. °· Chest pain. °· Sudden dizziness when you get up from sitting or lying down. °· Seizures. °· Nausea or vomiting. °· Abdominal pain. ° °How is this diagnosed? °The diagnosis of postpartum hypertension is made through a combination of physical examination findings and testing of your blood and urine. You may also have additional tests, such as a CT scan or an MRI, to check for other complications of postpartum hypertension. °How is this treated? °When blood pressure is high enough to require treatment, your options may include: °· Medicines to reduce blood pressure (antihypertensives). Tell your health care provider if you are breastfeeding or if you plan to breastfeed. There are many antihypertensive medicines that are safe to take while breastfeeding. °· Stopping medicines that may be causing hypertension. °· Treating medical conditions that are causing hypertension. °· Treating the complications of hypertension, such as seizures, stroke, or kidney problems. ° °Your health care provider will also continue to monitor your blood pressure closely and repeatedly until it is within a safe range for you. °Follow these instructions at home: °· Take medicines only as directed by your health care provider. °· Get regular exercise after your health care provider tells you that it is safe. °· Follow your health care provider’s recommendations on fluid and salt restrictions. °· Do not use any tobacco products, including cigarettes, chewing tobacco, or electronic cigarettes. If you need help quitting, ask your health care provider. °·   Keep all follow-up visits as directed by your health care provider. This is important. °Contact a health care provider  if: °· Your symptoms get worse. °· You have new symptoms, such as: °? Headache. °? Dizziness. °? Visual changes. °Get help right away if: °· You develop a severe or sudden headache. °· You have seizures. °· You develop numbness or weakness on one side of your body. °· You have difficulty thinking, speaking, or swallowing. °· You develop severe abdominal pain. °· You develop difficulty breathing, chest pain, a racing heartbeat, or heart palpitations. °These symptoms may represent a serious problem that is an emergency. Do not wait to see if the symptoms will go away. Get medical help right away. Call your local emergency services (911 in the [place] Do not drive yourself to the hospital. °This information is not intended to replace advice given to you by your health care provider. Make sure you discuss any questions you have with your health care provider. °Document Released: 04/01/2014 Document Revised: 01/01/2016 Document Reviewed: 02/10/2014 °Elsevier Interactive Patient Education © 2018 [employer] ° °

## 2017-06-20 NOTE — Lactation Note (Signed)
This note was copied from a baby's chart. Lactation Consultation Note: Mother reports that she is now only bottle feeding formula. Mother advised in measures to dry her milk. Advised mother to wear a good support bra and avoid any breast stimulation. Mother receptive to all teaching. [facility]. Mother has WIC number. Mother is aware of all available community services.   Patient Name: Sharon [name] ONGEX'BToday's Date: 06/20/2017 Reason for consult: Follow-up assessment   Maternal Data    Feeding Feeding Type: Bottle Fed - Formula Nipple Type: Slow - flow  LATCH Score                   Interventions    Lactation Tools Discussed/Used Tools: Bottle   Consult Status Consult Status: Complete    Michel BickersKendrick, Vyncent Overby McCoy 06/20/2017, 12:30 PM

## 2017-06-20 NOTE — MAU Note (Signed)
Pt reports she had a baby on Monday, vaginal delivery. States she is passing large clots and having lower abd cramping. States her feel are more swollen and she is worried it is because of her b/p

## 2017-06-20 NOTE — MAU Provider Note (Signed)
History     CSN: [id number]  Arrival date and time: 06/20/17 1556   First Provider Initiated Contact with Patient 06/20/17 1659      Chief Complaint  Patient presents with  . Vaginal Bleeding  . Abdominal Pain   HPI  Sharon [name] is a 21 y.o. G46P0101 female who presents with hypertension, abdominal cramping, & vaginal bleeding. Patient is 4 days s/p SVD.  Diagnosed with PIH at end of pregnancy, no meds. States she feels like she had some increased swelling today and thought it may be due to her BP. Denies headache, visual disturbance, or epigastric pain & had not checked her BP prior to arrival.  Denies complications with the delivery. States she has continued to have light bleeding but since yesterday has been passing multiple pea sized to half dollar sized clots. Reports mild lower abdominal cramping as well. Denies pain at this time. Denies fever/chills.   OB History    Gravida Para Term Preterm AB Living   1 1   1   1    SAB TAB Ectopic Multiple Live Births         0 1      Past Medical History:  Diagnosis Date  . Headache   . UTI (urinary tract infection)     Past Surgical History:  Procedure Laterality Date  . TOOTH EXTRACTION      Family History  Problem Relation Age of Onset  . Diabetes Maternal Grandmother     Social History   Tobacco Use  . Smoking status: Current Every Day Smoker    Packs/day: 0.25    Types: Cigarettes  . Smokeless tobacco: Never Used  Substance Use Topics  . Alcohol use: No  . Drug use: No    Allergies:  Allergies  Allergen Reactions  . Amoxil [Amoxicillin] Hives    Can take Keflex  . Penicillins Hives    Has patient had a PCN reaction causing immediate rash, facial/tongue/throat swelling, SOB or lightheadedness with hypotension: Yes Has patient had a PCN reaction causing severe rash involving mucus membranes or skin necrosis: No Has patient had a PCN reaction that required hospitalization: No Has patient had a PCN reaction  occurring within the last 10 years: No If all of the above answers are "NO", then may proceed with Cephalosporin use.     Medications Prior to Admission  Medication Sig Dispense Refill Last Dose  . acetaminophen (TYLENOL) 500 MG tablet Take 1,000 mg every 6 (six) hours as needed by mouth for mild pain or headache.    Past Month at Unknown time  . ibuprofen (ADVIL,MOTRIN) 200 MG tablet Take 400 mg every 6 (six) hours as needed by mouth for moderate pain.   06/20/2017 at Unknown time  . ibuprofen (ADVIL,MOTRIN) 600 MG tablet Take 1 tablet (600 mg total) every 6 (six) hours as needed by mouth. (Patient not taking: Reported on 06/20/2017) 40 tablet 1 Not Taking at Unknown time  . Prenatal MV-Min-Fe Fum-FA-DHA (PRENATAL 1 PO) Take 1 tablet daily by mouth.    06/15/2017    Review of Systems  Constitutional: Negative for chills and fever.  Eyes: Negative for visual disturbance.  Gastrointestinal: Positive for abdominal pain (none currently). Negative for constipation, diarrhea, nausea and vomiting.  Genitourinary: Positive for vaginal bleeding. Negative for dysuria.  Neurological: Negative for headaches.   Physical Exam   Blood pressure (!) 142/91, pulse 86, temperature 98.6 F (37 C), temperature source Axillary, resp. rate 17, height 5\' 2"  (1.575 m),  weight 230 lb (104.3 kg), SpO2 100 %, unknown if currently breastfeeding.  Patient Vitals for the past 24 hrs:  BP Temp Temp src Pulse Resp SpO2 Height Weight  06/20/17 1952 (!) 142/91 98.6 F (37 C) Axillary 86 17 100 % - -  06/20/17 1946 (!) 153/97 - - 92 - - - -  06/20/17 1944 (!) 151/85 - - 83 - - - -  06/20/17 1943 (!) 150/91 - - 75 - - - -  06/20/17 1816 (!) 141/89 - - 84 - - - -  06/20/17 1801 (!) 144/91 - - 85 - - - -  06/20/17 1731 135/85 - - 92 - - - -  06/20/17 1716 (!) 142/95 - - (!) 101 - - - -  06/20/17 1701 136/86 - - 96 - - - -  06/20/17 1646 130/70 - - 91 - - - -  06/20/17 1631 (!) 143/92 - - 95 - - - -  06/20/17 1621 (!)  161/98 - - 98 - - - -  06/20/17 1609 (!) 156/96 98.5 F (36.9 C) Oral 93 17 97 % 5\' 2"  (1.575 m) 230 lb (104.3 kg)     Physical Exam  Nursing note and vitals reviewed. Constitutional: She is oriented to person, place, and time. She appears well-developed and well-nourished. No distress.  HENT:  Head: Normocephalic and atraumatic.  Eyes: Conjunctivae are normal. Right eye exhibits no discharge. Left eye exhibits no discharge. No scleral icterus.  Neck: Normal range of motion.  Cardiovascular: Normal rate, regular rhythm and normal heart sounds.  No murmur heard. Respiratory: Effort normal and breath sounds normal. No respiratory distress. She has no wheezes.  GI: Soft. There is no tenderness.  Genitourinary: Uterus is enlarged. Uterus is not tender. There is bleeding in the vagina.  Genitourinary Comments: Small amount of dark red blood. Bright red tissue vs clot pulled from os, ~1-2 cm  Neurological: She is alert and oriented to person, place, and time.  Skin: Skin is warm and dry. She is not diaphoretic.  Psychiatric: She has a normal mood and affect. Her behavior is normal. Judgment and thought content normal.    MAU Course  Procedures Results for orders placed or performed during the hospital encounter of 06/20/17 (from the past 24 hour(s))  Protein / creatinine ratio, urine     Status: None   Collection Time: 06/20/17  4:30 PM  Result Value Ref Range   Creatinine, Urine 62.00 mg/dL   Total Protein, Urine 6 mg/dL   Protein Creatinine Ratio 0.10 0.00 - 0.15 mg/mg[Cre]  Urinalysis, Routine w reflex microscopic     Status: Abnormal   Collection Time: 06/20/17  4:30 PM  Result Value Ref Range   Color, Urine STRAW (A) YELLOW   APPearance CLEAR CLEAR   Specific Gravity, Urine 1.010 1.005 - 1.030   pH 6.0 5.0 - 8.0   Glucose, UA NEGATIVE NEGATIVE mg/dL   Hgb urine dipstick LARGE (A) NEGATIVE   Bilirubin Urine NEGATIVE NEGATIVE   Ketones, ur NEGATIVE NEGATIVE mg/dL   Protein,  ur NEGATIVE NEGATIVE mg/dL   Nitrite NEGATIVE NEGATIVE   Leukocytes, UA NEGATIVE NEGATIVE   RBC / HPF 0-5 0 - 5 RBC/hpf   WBC, UA 0-5 0 - 5 WBC/hpf   Bacteria, UA RARE (A) NONE SEEN   Squamous Epithelial / LPF 0-5 (A) NONE SEEN   Mucus PRESENT   CBC     Status: Abnormal   Collection Time: 06/20/17  5:11 PM  Result  Value Ref Range   WBC 10.9 (H) 4.0 - 10.5 K/uL   RBC 3.31 (L) 3.87 - 5.11 MIL/uL   Hemoglobin 10.4 (L) 12.0 - 15.0 g/dL   HCT 16.130.4 (L) 09.636.0 - 04.546.0 %   MCV 91.8 78.0 - 100.0 fL   MCH 31.4 26.0 - 34.0 pg   MCHC 34.2 30.0 - 36.0 g/dL   RDW 40.913.2 81.111.5 - 91.415.5 %   Platelets 238 150 - 400 K/uL  Comprehensive metabolic panel     Status: Abnormal   Collection Time: 06/20/17  5:11 PM  Result Value Ref Range   Sodium 140 135 - 145 mmol/L   Potassium 3.6 3.5 - 5.1 mmol/L   Chloride 107 101 - 111 mmol/L   CO2 25 22 - 32 mmol/L   Glucose, Bld 101 (H) 65 - 99 mg/dL   BUN 8 6 - 20 mg/dL   Creatinine, Ser 7.820.59 0.44 - 1.00 mg/dL   Calcium 8.3 (L) 8.9 - 10.3 mg/dL   Total Protein 6.2 (L) 6.5 - 8.1 g/dL   Albumin 2.6 (L) 3.5 - 5.0 g/dL   AST 21 15 - 41 U/L   ALT 16 14 - 54 U/L   Alkaline Phosphatase 103 38 - 126 U/L   Total Bilirubin 0.2 (L) 0.3 - 1.2 mg/dL   GFR calc non Af Amer >60 >60 mL/min   GFR calc Af Amer >60 >60 mL/min   Anion gap 8 5 - 15   [place] Pelvis (transabdominal Only)  Result Date: 06/20/2017 CLINICAL DATA:  The 4 days status post spontaneous vaginal delivery with vaginal bleeding. EXAM: TRANSABDOMINAL ULTRASOUND OF PELVIS TECHNIQUE: Transabdominal ultrasound examination of the pelvis was performed including evaluation of the uterus, ovaries, adnexal regions, and pelvic cul-de-sac. COMPARISON:  None. FINDINGS: Uterus Measurements: 19.7 x 7.2 x 8.8 cm. No fibroids or other mass visualized. Endometrium Thickness: 27.7 mm. Thickened and heterogeneous and somewhat echogenic material in the endometrial canal. No definite areas of vascularity are identified. Right ovary  Measurements: 4.0 x 1.6 x 2.6 cm. Normal appearance/no adnexal mass. Left ovary Measurements: 5.9 x 2.3 x 4.3 cm. Normal appearance/no adnexal mass. Other findings:  No abnormal free fluid. IMPRESSION: 1. Thickened, heterogeneous and echogenic material in the endometrial canal suggesting retained products. No definite areas of vascularity are identified. 2. No myometrial abnormalities. 3. Normal ovaries. 4. No free pelvic fluid collections. Electronically Signed   By: Rudie MeyerP.  Gallerani M.D.   On: 06/20/2017 19:04    MDM Severe range BP x 1. Otherwise remains elevated but not in severe range & patient asymptomatic.  CBC, CMP, urine PCR Ultrasound shows thickened endometrium at 27.7 mm & ?retained POC C/w Dr. Jackelyn KnifeMeisinger. Will have patient follow up in office on Monday/Tuesday for BP check. Will give methergine 0.2 mg PO TID x 2-3 days. Pt to call Meisinger over the weekend if symptoms worsen  Assessment and Plan  A:  1. Postpartum hypertension   2. Postpartum bleeding    P; Discharge home Rx methergine 0.2 mg TID #6, first dose given in MAU since patient can't make it to pharmacy Discussed reasons to return to MAU Call Dr. Jackelyn KnifeMeisinger for worsening symptoms over the weekend Call office Monday morning for f/u appointment & BP check  Judeth Hornrin Naveen Clardy 06/20/2017, 5:11 PM

## 2017-06-21 ENCOUNTER — Encounter (HOSPITAL_COMMUNITY): Payer: Self-pay | Admitting: Student

## 2017-08-12 NOTE — L&D Delivery Note (Signed)
Delivery Note Pt sat up for epidural at 8cm and spontaneously ruptured. A minute later, at 9:59 PM,  a viable female was delivered via Vaginal, Spontaneous (Presentation:ROA ; ).[name], midwife was present for delivery.   APGAR: 9,9, ; weight 4 lb 5.8 oz (1980 g).   I arrived shortly after and delivered the placenta Placenta status: delivered intact, shultz, .  Cord:3vc  with the following complications: none .  Cord pH: n/a  Anesthesia:  None 9 one dose stadol earlier) Episiotomy: None Lacerations: None Est. Blood Loss (mL): 100  Mom to postpartum.  Baby to NICU.  Sharon MusterCecilia W Anderson [name] 03/03/2018, 10:26 PM

## 2017-08-27 ENCOUNTER — Inpatient Hospital Stay (HOSPITAL_COMMUNITY)
Admission: AD | Admit: 2017-08-27 | Discharge: 2017-08-27 | Disposition: A | Discharge status: Home / Self Care | Payer: Medicaid Other | Source: Ambulatory Visit | Attending: Obstetrics and Gynecology | Admitting: Obstetrics and Gynecology

## 2017-08-27 ENCOUNTER — Encounter (HOSPITAL_COMMUNITY): Payer: Self-pay | Admitting: *Deleted

## 2017-08-27 DIAGNOSIS — N912 Amenorrhea, unspecified: Secondary | ICD-10-CM | POA: Diagnosis present

## 2017-08-27 DIAGNOSIS — Z79899 Other long term (current) drug therapy: Secondary | ICD-10-CM | POA: Insufficient documentation

## 2017-08-27 DIAGNOSIS — O99341 Other mental disorders complicating pregnancy, first trimester: Secondary | ICD-10-CM | POA: Insufficient documentation

## 2017-08-27 DIAGNOSIS — Z3A00 Weeks of gestation of pregnancy not specified: Secondary | ICD-10-CM | POA: Diagnosis not present

## 2017-08-27 DIAGNOSIS — F329 Major depressive disorder, single episode, unspecified: Secondary | ICD-10-CM | POA: Diagnosis not present

## 2017-08-27 NOTE — MAU Note (Signed)
Pt had pos HPT today, is on sertralline, is concerned about being pregnant & taking this med.  LMP was 12/18, thinks it may be a false positive test.  Denies pain.

## 2017-08-27 NOTE — MAU Provider Note (Signed)
[name] is a 22 y.o. G1P0101 who present to MAU today for pregnancy confirmation. She denies abdominal pain or vaginal bleeding.   BP 133/70 (BP Location: Right Arm)   Pulse (!) 120   Temp 98.6 F (37 C) (Oral)   Resp 16   Ht 5\' 2"  (1.575 m)   Wt 216 lb (98 kg)   LMP 07/29/2017   BMI 39.51 kg/m  CONSTITUTIONAL: Well-developed, well-nourished female in no acute distress.  CARDIOVASCULAR: Normal heart rate noted RESPIRATORY: Effort and breath sounds normal GASTROINTESTINAL:Soft, no distention noted.  No tenderness, rebound or guarding.  SKIN: Skin is warm and dry. No rash noted. Not diaphoretic. No erythema. No pallor. PSYCHIATRIC: Normal mood and affect. Normal behavior. Normal judgment and thought content.  MDM Medical screening exam complete Patient does not endorse any symptoms concerning for ectopic pregnancy or pregnancy related complication today.   A:  Amenorrhea  P: Discharge home [facility] called to get initial appointment before coming to MAU Patient concerned about medication she is currently on for depression [name]- discussed that this is safe to take in pregnancy and to continue.  Patient may return to MAU as needed or if her condition were to change or worsen. Patient advised to return if abdominal or vaginal bleeding occurs   Mcneil SoberRogers, Jammie Troup C, CNM 08/27/2017 6:11 PM

## 2017-09-30 LAB — OB RESULTS CONSOLE HEPATITIS B SURFACE ANTIGEN: HEP B S AG: NEGATIVE

## 2017-09-30 LAB — OB RESULTS CONSOLE RUBELLA ANTIBODY, IGM: Rubella: IMMUNE

## 2017-09-30 LAB — OB RESULTS CONSOLE RPR: RPR: NONREACTIVE

## 2017-09-30 LAB — OB RESULTS CONSOLE GC/CHLAMYDIA
CHLAMYDIA, DNA PROBE: NEGATIVE
Gonorrhea: NEGATIVE

## 2017-09-30 LAB — OB RESULTS CONSOLE HIV ANTIBODY (ROUTINE TESTING): HIV: NONREACTIVE

## 2017-10-07 ENCOUNTER — Other Ambulatory Visit (HOSPITAL_COMMUNITY): Payer: Self-pay | Admitting: Obstetrics and Gynecology

## 2017-10-07 DIAGNOSIS — Z8269 Family history of other diseases of the musculoskeletal system and connective tissue: Secondary | ICD-10-CM

## 2017-10-07 DIAGNOSIS — Z3689 Encounter for other specified antenatal screening: Secondary | ICD-10-CM

## 2017-10-07 DIAGNOSIS — Z3A28 28 weeks gestation of pregnancy: Secondary | ICD-10-CM

## 2017-12-04 ENCOUNTER — Encounter (HOSPITAL_COMMUNITY): Payer: Self-pay | Admitting: *Deleted

## 2017-12-05 ENCOUNTER — Encounter (HOSPITAL_COMMUNITY): Payer: Self-pay

## 2017-12-05 ENCOUNTER — Ambulatory Visit (HOSPITAL_COMMUNITY)
Admission: RE | Admit: 2017-12-05 | Discharge: 2017-12-05 | Disposition: A | Discharge status: Home / Self Care | Payer: Medicaid Other | Source: Ambulatory Visit | Attending: Obstetrics and Gynecology | Admitting: Obstetrics and Gynecology

## 2017-12-05 DIAGNOSIS — Z3A18 18 weeks gestation of pregnancy: Secondary | ICD-10-CM | POA: Insufficient documentation

## 2017-12-05 DIAGNOSIS — O09892 Supervision of other high risk pregnancies, second trimester: Secondary | ICD-10-CM | POA: Diagnosis present

## 2017-12-05 DIAGNOSIS — Z3689 Encounter for other specified antenatal screening: Secondary | ICD-10-CM

## 2017-12-05 DIAGNOSIS — O352XX0 Maternal care for (suspected) hereditary disease in fetus, not applicable or unspecified: Secondary | ICD-10-CM | POA: Insufficient documentation

## 2017-12-05 DIAGNOSIS — Z3A28 28 weeks gestation of pregnancy: Secondary | ICD-10-CM

## 2017-12-05 DIAGNOSIS — Z363 Encounter for antenatal screening for malformations: Secondary | ICD-10-CM | POA: Insufficient documentation

## 2017-12-05 DIAGNOSIS — O09212 Supervision of pregnancy with history of pre-term labor, second trimester: Secondary | ICD-10-CM | POA: Diagnosis not present

## 2017-12-05 DIAGNOSIS — Z8269 Family history of other diseases of the musculoskeletal system and connective tissue: Secondary | ICD-10-CM

## 2017-12-05 DIAGNOSIS — O09292 Supervision of pregnancy with other poor reproductive or obstetric history, second trimester: Secondary | ICD-10-CM | POA: Diagnosis not present

## 2018-01-08 ENCOUNTER — Other Ambulatory Visit (HOSPITAL_COMMUNITY): Payer: Self-pay | Admitting: *Deleted

## 2018-01-08 DIAGNOSIS — O359XX0 Maternal care for (suspected) fetal abnormality and damage, unspecified, not applicable or unspecified: Secondary | ICD-10-CM

## 2018-01-16 ENCOUNTER — Encounter (HOSPITAL_COMMUNITY): Payer: Self-pay

## 2018-01-16 ENCOUNTER — Inpatient Hospital Stay (HOSPITAL_COMMUNITY)
Admission: AD | Admit: 2018-01-16 | Discharge: 2018-01-16 | Disposition: A | Discharge status: Home / Self Care | Payer: Medicaid Other | Source: Ambulatory Visit | Attending: Obstetrics and Gynecology | Admitting: Obstetrics and Gynecology

## 2018-01-16 ENCOUNTER — Inpatient Hospital Stay (HOSPITAL_COMMUNITY): Payer: Medicaid Other

## 2018-01-16 ENCOUNTER — Ambulatory Visit (HOSPITAL_COMMUNITY)
Admission: RE | Admit: 2018-01-16 | Discharge: 2018-01-16 | Disposition: A | Discharge status: Home / Self Care | Payer: Medicaid Other | Source: Ambulatory Visit | Attending: Obstetrics and Gynecology | Admitting: Obstetrics and Gynecology

## 2018-01-16 DIAGNOSIS — K5900 Constipation, unspecified: Secondary | ICD-10-CM | POA: Diagnosis not present

## 2018-01-16 DIAGNOSIS — Z3A24 24 weeks gestation of pregnancy: Secondary | ICD-10-CM | POA: Insufficient documentation

## 2018-01-16 DIAGNOSIS — O359XX0 Maternal care for (suspected) fetal abnormality and damage, unspecified, not applicable or unspecified: Secondary | ICD-10-CM | POA: Diagnosis present

## 2018-01-16 DIAGNOSIS — Z881 Allergy status to other antibiotic agents status: Secondary | ICD-10-CM | POA: Insufficient documentation

## 2018-01-16 DIAGNOSIS — N1330 Unspecified hydronephrosis: Secondary | ICD-10-CM

## 2018-01-16 DIAGNOSIS — F17210 Nicotine dependence, cigarettes, uncomplicated: Secondary | ICD-10-CM | POA: Insufficient documentation

## 2018-01-16 DIAGNOSIS — Z880 Allergy status to penicillin: Secondary | ICD-10-CM | POA: Insufficient documentation

## 2018-01-16 DIAGNOSIS — R109 Unspecified abdominal pain: Secondary | ICD-10-CM | POA: Diagnosis not present

## 2018-01-16 DIAGNOSIS — O99612 Diseases of the digestive system complicating pregnancy, second trimester: Secondary | ICD-10-CM

## 2018-01-16 DIAGNOSIS — O26892 Other specified pregnancy related conditions, second trimester: Secondary | ICD-10-CM | POA: Insufficient documentation

## 2018-01-16 DIAGNOSIS — O212 Late vomiting of pregnancy: Secondary | ICD-10-CM | POA: Insufficient documentation

## 2018-01-16 DIAGNOSIS — O9989 Other specified diseases and conditions complicating pregnancy, childbirth and the puerperium: Secondary | ICD-10-CM

## 2018-01-16 DIAGNOSIS — R319 Hematuria, unspecified: Secondary | ICD-10-CM

## 2018-01-16 DIAGNOSIS — O99332 Smoking (tobacco) complicating pregnancy, second trimester: Secondary | ICD-10-CM | POA: Insufficient documentation

## 2018-01-16 LAB — CBC
HCT: 37.5 % (ref 36.0–46.0)
HEMOGLOBIN: 12.8 g/dL (ref 12.0–15.0)
MCH: 31.3 pg (ref 26.0–34.0)
MCHC: 34.1 g/dL (ref 30.0–36.0)
MCV: 91.7 fL (ref 78.0–100.0)
PLATELETS: 222 10*3/uL (ref 150–400)
RBC: 4.09 MIL/uL (ref 3.87–5.11)
RDW: 13.6 % (ref 11.5–15.5)
WBC: 12.7 10*3/uL — ABNORMAL HIGH (ref 4.0–10.5)

## 2018-01-16 LAB — URINALYSIS, ROUTINE W REFLEX MICROSCOPIC
Bilirubin Urine: NEGATIVE
Glucose, UA: NEGATIVE mg/dL
Ketones, ur: NEGATIVE mg/dL
Leukocytes, UA: NEGATIVE
Nitrite: NEGATIVE
Protein, ur: NEGATIVE mg/dL
RBC / HPF: >50 RBC/hpf — ABNORMAL HIGH (ref 0–5)
SPECIFIC GRAVITY, URINE: 1.018 (ref 1.005–1.030)
pH: 8 (ref 5.0–8.0)

## 2018-01-16 MED ORDER — HYDROMORPHONE HCL 1 MG/ML IJ SOLN
1.0000 mg | Freq: Once | INTRAMUSCULAR | Status: AC
  Administered 2018-01-16: 1 mg via INTRAMUSCULAR
  Filled 2018-01-16: qty 1

## 2018-01-16 MED ORDER — ONDANSETRON HCL 4 MG/2ML IJ SOLN
4.0000 mg | Freq: Once | INTRAMUSCULAR | Status: AC
  Administered 2018-01-16: 4 mg via INTRAVENOUS
  Filled 2018-01-16: qty 2

## 2018-01-16 MED ORDER — OXYCODONE-ACETAMINOPHEN 5-325 MG PO TABS: 1.0000 | ORAL_TABLET | Freq: Four times a day (QID) | ORAL | 0 refills | Status: DC | PRN

## 2018-01-16 MED ORDER — TAMSULOSIN HCL 0.4 MG PO CAPS: 0.4000 mg | ORAL_CAPSULE | Freq: Every day | ORAL | 0 refills | Status: DC

## 2018-01-16 MED ORDER — SIMETHICONE 80 MG PO CHEW
80.0000 mg | CHEWABLE_TABLET | Freq: Once | ORAL | Status: AC
  Administered 2018-01-16: 80 mg via ORAL
  Filled 2018-01-16: qty 1

## 2018-01-16 MED ORDER — FLEET ENEMA 7-19 GM/118ML RE ENEM
1.0000 | ENEMA | Freq: Once | RECTAL | Status: AC
  Administered 2018-01-16: 1 via RECTAL

## 2018-01-16 MED ORDER — LACTATED RINGERS IV BOLUS
1000.0000 mL | Freq: Once | INTRAVENOUS | Status: AC
  Administered 2018-01-16: 1000 mL via INTRAVENOUS

## 2018-01-16 NOTE — MAU Note (Signed)
Patient reports CTX 1 minutes apart and vomiting since 0300.  No LOF/VB.  + FM.  States she has a history of PTL at 35 weeks with her previous pregnancy.

## 2018-01-16 NOTE — MAU Provider Note (Addendum)
History     CSN: [id number]  Arrival date and time: 01/16/18 4098  Chief Complaint  Patient presents with  . Contractions  . Emesis   HPI Sharon [name] is a 22 y.o. G2P0101 at [redacted]w[redacted]d who presents with abdominal pain and vomiting. She states she woke up at 0300 with vomiting and then started to feel abdominal pain after that. She states the pain feels like it is every 1 minute and she rates them a 9/10. She denies any leaking or bleeding. Reports good fetal movement. Denies any urinary symptoms. Last intercourse last night. She has a history of PPROM at 35 weeks with her first baby. She is currently doing 17P.   RN Note: Patient reports CTX 1 minutes apart and vomiting since 0300.  No LOF/VB.  + FM.  States she has a history of PTL at 35 weeks with her previous pregnancy    OB History    Gravida  2   Para  1   Term      Preterm  1   AB      Living  1     SAB      TAB      Ectopic      Multiple  0   Live Births  1           Past Medical History:  Diagnosis Date  . Headache   . UTI (urinary tract infection)     Past Surgical History:  Procedure Laterality Date  . NO PAST SURGERIES    . TOOTH EXTRACTION      Family History  Problem Relation Age of Onset  . Diabetes Maternal Grandmother     Social History   Tobacco Use  . Smoking status: Current Every Day Smoker    Packs/day: 0.25    Types: Cigarettes  . Smokeless tobacco: Never Used  Substance Use Topics  . Alcohol use: No  . Drug use: No    Allergies:  Allergies  Allergen Reactions  . Amoxil [Amoxicillin] Hives    Can take Keflex  . Penicillins Hives    Has patient had a PCN reaction causing immediate rash, facial/tongue/throat swelling, SOB or lightheadedness with hypotension: Yes Has patient had a PCN reaction causing severe rash involving mucus membranes or skin necrosis: No Has patient had a PCN reaction that required hospitalization: No Has patient had a PCN reaction occurring  within the last 10 years: No If all of the above answers are "NO", then may proceed with Cephalosporin use.     Medications Prior to Admission  Medication Sig Dispense Refill Last Dose  . acetaminophen (TYLENOL) 500 MG tablet Take 1,000 mg every 6 (six) hours as needed by mouth for mild pain or headache.    Not Taking  . Prenatal MV-Min-Fe Fum-FA-DHA (PRENATAL 1 PO) Take 1 tablet daily by mouth.    Taking    Review of Systems  Constitutional: Negative.  Negative for fatigue and fever.  HENT: Negative.   Respiratory: Negative.  Negative for shortness of breath.   Cardiovascular: Negative.  Negative for chest pain.  Gastrointestinal: Positive for abdominal pain. Negative for constipation, diarrhea, nausea and vomiting.  Genitourinary: Negative.  Negative for dysuria, vaginal bleeding and vaginal discharge.  Neurological: Negative.  Negative for dizziness and headaches.   Physical Exam   Blood pressure 124/77, pulse 97, temperature 97.8 F (36.6 C), temperature source Oral, resp. rate 19, height 5\' 2"  (1.575 m), weight 215 lb (97.5 kg), last menstrual period  07/29/2017, SpO2 100 %, unknown if currently breastfeeding.   Patient Vitals for the past 24 hrs:  BP Temp Temp src Pulse Resp SpO2 Height Weight  01/16/18 0715 132/86 - - 98 - - - -  01/16/18 0658 124/77 - - 97 - - - -  01/16/18 [id number] (!) 142/73 97.8 F (36.6 C) Oral (!) 104 19 100 % 5\' 2"  (1.575 m) 215 lb (97.5 kg)   Physical Exam  Nursing note and vitals reviewed. Constitutional: She is oriented to person, place, and time. She appears well-developed and well-nourished. No distress.  HENT:  Head: Normocephalic.  Eyes: Pupils are equal, round, and reactive to light.  Cardiovascular: Normal rate, regular rhythm and normal heart sounds.  Respiratory: Effort normal and breath sounds normal. No respiratory distress.  GI: Soft. Bowel sounds are normal. She exhibits no distension. There is no tenderness. There is no rigidity, no  guarding and no CVA tenderness.  Neurological: She is alert and oriented to person, place, and time.  Skin: Skin is warm and dry.  Psychiatric: She has a normal mood and affect. Her behavior is normal. Judgment and thought content normal.   Fetal Tracing:  Baseline: 140 Variability: moderate Accels: 10x10  Decels: none   Toco: none  Dilation: Closed Effacement (%): Thick Exam by:: Cleone Slimaroline Neill, CNM  MAU Course  Procedures Results for orders placed or performed during the hospital encounter of 01/16/18 (from the past 24 hour(s))  CBC     Status: Abnormal   Collection Time: 01/16/18  6:37 AM  Result Value Ref Range   WBC 12.7 (H) 4.0 - 10.5 K/uL   RBC 4.09 3.87 - 5.11 MIL/uL   Hemoglobin 12.8 12.0 - 15.0 g/dL   HCT 96.037.5 45.436.0 - 09.846.0 %   MCV 91.7 78.0 - 100.0 fL   MCH 31.3 26.0 - 34.0 pg   MCHC 34.1 30.0 - 36.0 g/dL   RDW 11.913.6 14.711.5 - 82.915.5 %   Platelets 222 150 - 400 K/uL  Urinalysis, Routine w reflex microscopic     Status: Abnormal   Collection Time: 01/16/18  7:04 AM  Result Value Ref Range   Color, Urine YELLOW YELLOW   APPearance TURBID (A) CLEAR   Specific Gravity, Urine 1.018 1.005 - 1.030   pH 8.0 5.0 - 8.0   Glucose, UA NEGATIVE NEGATIVE mg/dL   Hgb urine dipstick LARGE (A) NEGATIVE   Bilirubin Urine NEGATIVE NEGATIVE   Ketones, ur NEGATIVE NEGATIVE mg/dL   Protein, ur NEGATIVE NEGATIVE mg/dL   Nitrite NEGATIVE NEGATIVE   Leukocytes, UA NEGATIVE NEGATIVE   RBC / HPF >50 (H) 0 - 5 RBC/hpf   Bacteria, UA RARE (A) NONE SEEN   Squamous Epithelial / LPF 0-5 0 - 5   Mucus PRESENT    MDM Prenatal records from private office reviewed/not on file. Pregnancy complicated by hx of PPROM and PIH.  Labs ordered and reviewed. UA CBC LR bolus Zofran 4mg  IV Cervix unchanged after 1 hour. Patient reporting feeling constipated at this time, attempting to have a bowel movement.   Consulted with Dr. Jackelyn KnifeMeisinger- ok to discharge home with preterm labor precautions and  comfort measures for constipation.  Patient requesting "gas medicine" and an enema- simethicone and fleets ordered  [facility] Tylin Force CNM at 0800. Rolm BookbinderCaroline M Neill, CNM 01/16/18 8:00 AM  Assessment and Plan   Did not get results or relief from enema. With her symptoms and exam findings, as well as her colicky pain pattern  I decided to do a renal US US Renal  Result Date: 01/16/2018 CLINICAL DATA:  Hematuria.  Flank pain.  Twenty-four weeks pregnant. EXAM: RENAL / URINARY TRACT ULTRASOUND COMPLETE COMPARISON:  None. FINDINGS: Right Kidney: Length: 11.1 cm. Echogenicity within normal limits. No mass or hydronephrosis visualized. Left Kidney: Length: 14.8 cm. Mild left hydronephrosis with AP left renal pelvis diameter 11 mm. Mildly echogenic left kidney. Normal left renal parenchymal thickness. No left renal mass. No left renal stones demonstrated. Bladder: Bladder completely empty and cannot be evaluated on this scan. IMPRESSION: 1. Mild left hydronephrosis. Obstructing left-sided stone cannot be excluded. 2. 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 3. Normal right kidney with no right hydronephrosis. 4. Bladder completely empty and cannot be evaluated on this scan. Electronically Signed   By: Delbert Phenix M.D.   On: 01/16/2018 10:10   Dilaudid given with good relief of pain Discussed findings with Dr Jackelyn Knife.  Will give Flomax and a strainer with followup in office early next week If not improved will refer to urology Rx Percocet limited supply for pain Aviva Signs, CNM

## 2018-01-16 NOTE — Discharge Instructions (Signed)

## 2018-01-17 LAB — CULTURE, OB URINE: Culture: <10000 — AB

## 2018-01-22 ENCOUNTER — Encounter (HOSPITAL_COMMUNITY): Payer: Self-pay

## 2018-03-03 ENCOUNTER — Inpatient Hospital Stay (HOSPITAL_COMMUNITY)
Admission: AD | Admit: 2018-03-03 | Discharge: 2018-03-05 | DRG: 807 | Disposition: A | Discharge status: Home / Self Care | Payer: Medicaid Other | Attending: Obstetrics and Gynecology | Admitting: Obstetrics and Gynecology

## 2018-03-03 ENCOUNTER — Encounter (HOSPITAL_COMMUNITY): Payer: Self-pay | Admitting: *Deleted

## 2018-03-03 ENCOUNTER — Other Ambulatory Visit: Payer: Self-pay

## 2018-03-03 DIAGNOSIS — F17210 Nicotine dependence, cigarettes, uncomplicated: Secondary | ICD-10-CM | POA: Diagnosis present

## 2018-03-03 DIAGNOSIS — O99334 Smoking (tobacco) complicating childbirth: Secondary | ICD-10-CM | POA: Diagnosis present

## 2018-03-03 DIAGNOSIS — Z880 Allergy status to penicillin: Secondary | ICD-10-CM | POA: Diagnosis not present

## 2018-03-03 DIAGNOSIS — O358XX0 Maternal care for other (suspected) fetal abnormality and damage, not applicable or unspecified: Secondary | ICD-10-CM | POA: Diagnosis present

## 2018-03-03 DIAGNOSIS — Z3490 Encounter for supervision of normal pregnancy, unspecified, unspecified trimester: Secondary | ICD-10-CM

## 2018-03-03 DIAGNOSIS — Z3A31 31 weeks gestation of pregnancy: Secondary | ICD-10-CM

## 2018-03-03 LAB — TYPE AND SCREEN
ABO/RH(D): A POS
ANTIBODY SCREEN: NEGATIVE

## 2018-03-03 LAB — URINALYSIS, ROUTINE W REFLEX MICROSCOPIC
Bilirubin Urine: NEGATIVE
GLUCOSE, UA: NEGATIVE mg/dL
HGB URINE DIPSTICK: NEGATIVE
Ketones, ur: 5 mg/dL — AB
LEUKOCYTES UA: NEGATIVE
Nitrite: NEGATIVE
PH: 6 (ref 5.0–8.0)
Protein, ur: NEGATIVE mg/dL
Specific Gravity, Urine: 1.019 (ref 1.005–1.030)

## 2018-03-03 LAB — CBC
HEMATOCRIT: 34.8 % — AB (ref 36.0–46.0)
HEMOGLOBIN: 11.7 g/dL — AB (ref 12.0–15.0)
MCH: 30.9 pg (ref 26.0–34.0)
MCHC: 33.6 g/dL (ref 30.0–36.0)
MCV: 91.8 fL (ref 78.0–100.0)
Platelets: 247 10*3/uL (ref 150–400)
RBC: 3.79 MIL/uL — AB (ref 3.87–5.11)
RDW: 13.4 % (ref 11.5–15.5)
WBC: 13.9 10*3/uL — ABNORMAL HIGH (ref 4.0–10.5)

## 2018-03-03 LAB — FETAL FIBRONECTIN: Fetal Fibronectin: NEGATIVE

## 2018-03-03 MED ORDER — ACETAMINOPHEN 325 MG PO TABS
650.0000 mg | ORAL_TABLET | ORAL | Status: DC | PRN
  Administered 2018-03-03: 650 mg via ORAL
  Filled 2018-03-03: qty 2

## 2018-03-03 MED ORDER — ONDANSETRON HCL 4 MG/2ML IJ SOLN: 4.0000 mg | Freq: Four times a day (QID) | INTRAMUSCULAR | Status: DC | PRN

## 2018-03-03 MED ORDER — NIFEDIPINE 10 MG PO CAPS
10.0000 mg | ORAL_CAPSULE | Freq: Once | ORAL | Status: AC
  Administered 2018-03-03: 10 mg via ORAL
  Filled 2018-03-03: qty 1

## 2018-03-03 MED ORDER — OXYTOCIN 10 UNIT/ML IJ SOLN
INTRAMUSCULAR | Status: AC
  Filled 2018-03-03: qty 1

## 2018-03-03 MED ORDER — OXYTOCIN BOLUS FROM INFUSION: 500.0000 mL | Freq: Once | INTRAVENOUS | Status: DC

## 2018-03-03 MED ORDER — EPHEDRINE 5 MG/ML INJ
10.0000 mg | INTRAVENOUS | Status: DC | PRN
  Filled 2018-03-03: qty 2

## 2018-03-03 MED ORDER — OXYTOCIN 10 UNIT/ML IJ SOLN
10.0000 [IU] | Freq: Once | INTRAMUSCULAR | Status: AC
  Administered 2018-03-03: 10 [IU] via INTRAMUSCULAR

## 2018-03-03 MED ORDER — FENTANYL 2.5 MCG/ML BUPIVACAINE 1/10 % EPIDURAL INFUSION (WH - ANES)
INTRAMUSCULAR | Status: AC
  Filled 2018-03-03: qty 100

## 2018-03-03 MED ORDER — ZOLPIDEM TARTRATE 5 MG PO TABS: 5.0000 mg | ORAL_TABLET | Freq: Every evening | ORAL | Status: DC | PRN

## 2018-03-03 MED ORDER — IBUPROFEN 600 MG PO TABS
600.0000 mg | ORAL_TABLET | Freq: Four times a day (QID) | ORAL | Status: DC
  Administered 2018-03-03 – 2018-03-05 (×5): 600 mg via ORAL
  Filled 2018-03-03 (×5): qty 1

## 2018-03-03 MED ORDER — OXYTOCIN 40 UNITS IN LACTATED RINGERS INFUSION - SIMPLE MED: 2.5000 [IU]/h | INTRAVENOUS | Status: DC

## 2018-03-03 MED ORDER — DOCUSATE SODIUM 100 MG PO CAPS
100.0000 mg | ORAL_CAPSULE | Freq: Every day | ORAL | Status: DC
  Filled 2018-03-03 (×2): qty 1

## 2018-03-03 MED ORDER — LACTATED RINGERS IV SOLN: INTRAVENOUS | Status: DC

## 2018-03-03 MED ORDER — BUTORPHANOL TARTRATE 1 MG/ML IJ SOLN
1.0000 mg | INTRAMUSCULAR | Status: DC | PRN
  Administered 2018-03-03: 1 mg via INTRAVENOUS
  Filled 2018-03-03: qty 1

## 2018-03-03 MED ORDER — LACTATED RINGERS IV SOLN: 500.0000 mL | INTRAVENOUS | Status: DC | PRN

## 2018-03-03 MED ORDER — MAGNESIUM SULFATE 40 G IN LACTATED RINGERS - SIMPLE
1.0000 g/h | INTRAVENOUS | Status: DC
  Administered 2018-03-03: 1 g/h via INTRAVENOUS
  Filled 2018-03-03: qty 40

## 2018-03-03 MED ORDER — LIDOCAINE HCL (PF) 1 % IJ SOLN
30.0000 mL | INTRAMUSCULAR | Status: DC | PRN
  Filled 2018-03-03: qty 30

## 2018-03-03 MED ORDER — FENTANYL 2.5 MCG/ML BUPIVACAINE 1/10 % EPIDURAL INFUSION (WH - ANES): 14.0000 mL/h | INTRAMUSCULAR | Status: DC | PRN

## 2018-03-03 MED ORDER — LACTATED RINGERS IV SOLN: 500.0000 mL | Freq: Once | INTRAVENOUS | Status: DC

## 2018-03-03 MED ORDER — CEFAZOLIN SODIUM-DEXTROSE 1-4 GM/50ML-% IV SOLN
1.0000 g | Freq: Three times a day (TID) | INTRAVENOUS | Status: DC
  Filled 2018-03-03 (×2): qty 50

## 2018-03-03 MED ORDER — CALCIUM CARBONATE ANTACID 500 MG PO CHEW: 2.0000 | CHEWABLE_TABLET | ORAL | Status: DC | PRN

## 2018-03-03 MED ORDER — PHENYLEPHRINE 40 MCG/ML (10ML) SYRINGE FOR IV PUSH (FOR BLOOD PRESSURE SUPPORT)
80.0000 ug | PREFILLED_SYRINGE | INTRAVENOUS | Status: DC | PRN
  Filled 2018-03-03: qty 5

## 2018-03-03 MED ORDER — NIFEDIPINE 10 MG PO CAPS
10.0000 mg | ORAL_CAPSULE | Freq: Four times a day (QID) | ORAL | Status: DC
  Administered 2018-03-03: 10 mg via ORAL
  Filled 2018-03-03: qty 1

## 2018-03-03 MED ORDER — PRENATAL MULTIVITAMIN CH
1.0000 | ORAL_TABLET | Freq: Every day | ORAL | Status: DC
  Administered 2018-03-03: 1 via ORAL
  Filled 2018-03-03 (×3): qty 1

## 2018-03-03 MED ORDER — BETAMETHASONE SOD PHOS & ACET 6 (3-3) MG/ML IJ SUSP
12.0000 mg | INTRAMUSCULAR | Status: DC
  Filled 2018-03-03: qty 2

## 2018-03-03 MED ORDER — SOD CITRATE-CITRIC ACID 500-334 MG/5ML PO SOLN: 30.0000 mL | ORAL | Status: DC | PRN

## 2018-03-03 MED ORDER — CEFAZOLIN SODIUM-DEXTROSE 2-4 GM/100ML-% IV SOLN
2.0000 g | Freq: Once | INTRAVENOUS | Status: AC
  Administered 2018-03-03: 2 g via INTRAVENOUS
  Filled 2018-03-03: qty 100

## 2018-03-03 MED ORDER — CEFAZOLIN SODIUM-DEXTROSE 2-4 GM/100ML-% IV SOLN: 2.0000 g | Freq: Four times a day (QID) | INTRAVENOUS | Status: DC

## 2018-03-03 MED ORDER — DIPHENHYDRAMINE HCL 50 MG/ML IJ SOLN: 12.5000 mg | INTRAMUSCULAR | Status: DC | PRN

## 2018-03-03 MED ORDER — MAGNESIUM SULFATE BOLUS VIA INFUSION
6.0000 g | Freq: Once | INTRAVENOUS | Status: AC
  Administered 2018-03-03: 6 g via INTRAVENOUS
  Filled 2018-03-03: qty 500

## 2018-03-03 MED ORDER — PHENYLEPHRINE 40 MCG/ML (10ML) SYRINGE FOR IV PUSH (FOR BLOOD PRESSURE SUPPORT)
PREFILLED_SYRINGE | INTRAVENOUS | Status: AC
  Filled 2018-03-03: qty 20

## 2018-03-03 MED ORDER — BETAMETHASONE SOD PHOS & ACET 6 (3-3) MG/ML IJ SUSP
12.0000 mg | INTRAMUSCULAR | Status: DC
  Administered 2018-03-03: 12 mg via INTRAMUSCULAR
  Filled 2018-03-03 (×2): qty 2

## 2018-03-03 MED ORDER — CLINDAMYCIN PHOSPHATE 900 MG/50ML IV SOLN: 900.0000 mg | Freq: Four times a day (QID) | INTRAVENOUS | Status: DC

## 2018-03-03 NOTE — Consult Note (Signed)
Neonatology Consultation Requested by: Mindi SlickerBanga Reason: pre-term labor 31 weeks  The patient presented with labor pains but without rupture of membranes.  She is currently being treated with magnesium sulfate as well as betamethasone.  I was asked by Dr. Mindi SlickerBanga to speak with Sharon [name].  I discussed the usual duration of hospitalization for babies born at this gestation, emphasizing that respiratory problems might need mechanical support, but that this was unlikely.  I emphasized that most of the support tended to be towards nutrition, and that babies were at some increased risk for infection, but low risk for intracranial hemorrhage.  We discussed the usual duration of hospitalization.  I told the patient that if she had further questions or concerns to feel free to contact me to confer again.  [name] MD

## 2018-03-03 NOTE — MAU Provider Note (Signed)
History     CSN: [id number]  Arrival date and time: 03/03/18 1313   First Provider Initiated Contact with Patient 03/03/18 1354      Chief Complaint  Patient presents with  . Back Pain  . Abdominal Pain  . Vaginal Discharge   HPI  Sharon [name] is a 22 y.o. G2P0101 at [redacted]w[redacted]d who presents to MAU with chief complaint of abdominal contractions q 5 minutes since early this morning. Denies vaginal bleeding, leaking of fluid, decreased fetal movement, fever, falls, or recent illness.    History is significant for preterm delivery at 35 weeks  OB History    Gravida  2   Para  1   Term      Preterm  1   AB      Living  1     SAB      TAB      Ectopic      Multiple  0   Live Births  1           Past Medical History:  Diagnosis Date  . Headache   . Kidney stone   . UTI (urinary tract infection)     Past Surgical History:  Procedure Laterality Date  . NO PAST SURGERIES    . TOOTH EXTRACTION      Family History  Problem Relation Age of Onset  . Diabetes Maternal Grandmother     Social History   Tobacco Use  . Smoking status: Current Every Day Smoker    Packs/day: 0.25    Types: Cigarettes  . Smokeless tobacco: Never Used  Substance Use Topics  . Alcohol use: No  . Drug use: No    Allergies:  Allergies  Allergen Reactions  . Amoxil [Amoxicillin] Hives    Can take Keflex  . Penicillins Hives    Has patient had a PCN reaction causing immediate rash, facial/tongue/throat swelling, SOB or lightheadedness with hypotension: Yes Has patient had a PCN reaction causing severe rash involving mucus membranes or skin necrosis: No Has patient had a PCN reaction that required hospitalization: No Has patient had a PCN reaction occurring within the last 10 years: No If all of the above answers are "NO", then may proceed with Cephalosporin use.     Medications Prior to Admission  Medication Sig Dispense Refill Last Dose  . hydroxyprogesterone caproate  (MAKENA) 250 mg/mL OIL injection Inject 250 mg into the muscle once.   Taking  . oxyCODONE-acetaminophen (PERCOCET/ROXICET) 5-325 MG tablet Take 1-2 tablets by mouth every 6 (six) hours as needed for severe pain. 20 tablet 0 Taking  . Prenatal MV-Min-Fe Fum-FA-DHA (PRENATAL 1 PO) Take 1 tablet daily by mouth.    Taking  . Prenatal Vit w/Fe-Methylfol-FA (PNV PO) Take by mouth.     . tamsulosin (FLOMAX) 0.4 MG CAPS capsule Take 1 capsule (0.4 mg total) by mouth daily. 30 capsule 0 Taking    Review of Systems  Constitutional: Negative for fever.  Gastrointestinal: Positive for abdominal pain.       Bilateral, low abdomen, radiating to back, 6-8/10  Genitourinary: Negative for vaginal bleeding, vaginal discharge and vaginal pain.  Neurological: Negative for headaches.  All other systems reviewed and are negative.  Physical Exam   Blood pressure 128/70, pulse (!) 112, temperature 98.7 F (37.1 C), resp. rate 18, weight 225 lb 4 oz (102.2 kg), last menstrual period 07/29/2017, SpO2 97 %, unknown if currently breastfeeding.  Physical Exam  Nursing note and vitals reviewed. Constitutional: She  appears well-developed and well-nourished.  Cardiovascular: Normal rate, regular rhythm, normal heart sounds and intact distal pulses.  Genitourinary:  Genitourinary Comments: SVE 2.5-3/thick but soft/-2 Sutures palpated    MAU Course  Procedures  MDM  Preterm labor, cervical change to 2.5-3cm Reactive NST Baseline 140, moderate variability, positive accelerations, variable decelerations Toco: irregular contractions q 2-6 min, palpate mild to moderate  Patient Vitals for the past 24 hrs:  BP Temp Pulse Resp SpO2 Weight  03/03/18 1331 128/70 98.7 F (37.1 C) (!) 112 18 97 % 225 lb 4 oz (102.2 kg)    Meds ordered this encounter  Medications  . NIFEdipine (PROCARDIA) capsule 10 mg  . betamethasone acetate-betamethasone sodium phosphate (CELESTONE) injection 12 mg   Assessment and Plan   --Preterm cervical dilatation --Reactive NST --FFN collected prior to SVE --Dr. Mindi SlickerBanga called with report of cervical exam --Procardia and Betamethasone #1 of 2 given in MAU --Admit to Antepartum, order set placed  Presentation, clinical findings, and plan discussed with Dr. Mindi SlickerBanga.    Calvert CantorSamantha C Summerlyn Fickel, CNM 03/03/2018, 2:26 PM

## 2018-03-03 NOTE — Progress Notes (Addendum)
Patient ID: Sharon [name], female   DOB: 15-Dec-1995, 22 y.o.   MRN: [id number] Pt reports painful contractions. Desires pain medication SVE - unchanged at 5-6cm dil Will give a dose of stadol Will also start on Clindamycin for GBS prophylaxis as unknown Answered all pt and family questions

## 2018-03-03 NOTE — MAU Note (Signed)
Since last night, stomach was hurting like she needed to have a BM.  Today, she has been having cramping in her abd, low back pain, coming every 5min, lasting about a minute.  ? Lost her plug. Delivered her first baby at 35wks.  Getting weekly Eilene GhaziMcKenna shots.

## 2018-03-03 NOTE — H&P (Signed)
Barbaraann ShareFaith Barreiro is a 22 y.o. 352P0101 female presenting at 4831 0/7wks with complaint of painful irregular contractions since last night; feels like needs to have a BM/ She reports contractions are q 5mins. Denies VB or LOF.  She is dated by LMP which was confirmed by 8 week US. This was a short interval preganncy; [facility] 06/2017 [facility] 35 [facility].  She has been receiving makena since 16 weeks though missed initial shots. 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 Passed kidney stone a few weeks ago. GBS unknown. FFN in mau negative. UA neg.   OB History    Gravida  2   Para  1   Term      Preterm  1   AB      Living  1     SAB      TAB      Ectopic      Multiple  0   Live Births  1          Past Medical History:  Diagnosis Date  . Headache   . Kidney stone   . UTI (urinary tract infection)    Past Surgical History:  Procedure Laterality Date  . NO PAST SURGERIES    . TOOTH EXTRACTION     Family History: family history includes Diabetes in her maternal grandmother. Social History:  reports that she has been smoking cigarettes.  She has been smoking about 0.25 packs per day. She has never used smokeless tobacco. She reports that she does not drink alcohol or use drugs.     Maternal Diabetes: No Genetic Screening: Declined Maternal Ultrasounds/Referrals: Abnormal:  Findings:   Other:clubfoot Fetal Ultrasounds or other Referrals:  Referred to Materal Fetal Medicine  Maternal Substance Abuse:  No Significant Maternal Medications:  Meds include: Zoloft Other: makena Significant Maternal Lab Results:  None Other Comments:  None  Review of Systems  Constitutional: Positive for malaise/fatigue. Negative for chills, fever and weight loss.  Eyes: Negative for blurred vision and double vision.  Respiratory: Negative for shortness of breath.   Cardiovascular: Negative for chest pain.  Gastrointestinal: Positive for abdominal pain.  Negative for heartburn, nausea and vomiting.  Genitourinary: Negative for dysuria.  Musculoskeletal: Positive for back pain and myalgias.  Skin: Negative for rash.  Neurological: Negative for dizziness and headaches.  Endo/Heme/Allergies: Does not bruise/bleed easily.  Psychiatric/Behavioral: Negative for depression, hallucinations, substance abuse and suicidal ideas. The patient is nervous/anxious.    Maternal Medical History:  Reason for admission: Contractions.  Nausea.  Contractions: Onset was yesterday.   Frequency: irregular.   Perceived severity is moderate.    Fetal activity: Perceived fetal activity is normal.   Last perceived fetal movement was within the past hour.    Prenatal complications: PIH (history of pih last pregnancy) and preterm labor (hx of preterm delivery; on makena this pregnancy).   Prenatal Complications - Diabetes: none.    Dilation: 2.5 Effacement (%): Thick Exam by:: Reita ClicheWeinhold, CNM Blood pressure 136/84, pulse (!) 105, temperature 98.4 F (36.9 C), temperature source Oral, resp. rate 18, height 5' 2.01" (1.575 m), weight 225 lb (102.1 kg), last menstrual period 07/29/2017, SpO2 98 %, unknown if currently breastfeeding. Maternal Exam:  Uterine Assessment: Contraction strength is mild.  Contraction frequency is irregular.   Abdomen: Patient reports generalized tenderness.  Estimated fetal weight is AGA 63%ile last check.   Fetal presentation: vertex  Introitus: Normal vulva. Normal vagina.  Pelvis: adequate  for delivery.   Cervix: Cervix evaluated by digital exam.     Fetal Exam Fetal Monitor Review: Mode: fetoscope.   Baseline rate: 140.  Variability: moderate (6-25 bpm).   Pattern: accelerations present and no decelerations.    Fetal State Assessment: Category I - tracings are normal.     Physical Exam  Constitutional: She is oriented to person, place, and time. She appears well-developed and well-nourished.  Neck: Normal range of  motion.  Cardiovascular: Normal rate.  Respiratory: Effort normal.  GI: Soft. There is generalized tenderness.  Genitourinary: Vagina normal and uterus normal.  Musculoskeletal: Normal range of motion. She exhibits no edema.  Neurological: She is alert and oriented to person, place, and time.  Skin: Skin is warm.  Psychiatric: She has a normal mood and affect. Her behavior is normal. Judgment and thought content normal.    Prenatal labs: ABO, Rh: --/--/A POS (07/23 1420) Antibody: NEG (07/23 1420) Rubella:   RPR: Nonreactive (11/06 0000)  HBsAg:    HIV: Non-reactive, Non-reactive (09/12 0000)  GBS: Negative (11/05 0000)   Assessment/Plan: [id number] at 31 0/7wks with preterm labor; hx preterm delivery Rechecked cervix and it is: 5-6cm dil  Transfer to L/D: staff notified Start on MgSo4 - pt counseled - for CP prophylaxis Second dose of BMZ at 0227 on 7/24 if still pregnant Stop procardia NICU consult place and neonatologist informed  Cathrine Muster 03/03/2018, 6:39 PM

## 2018-03-03 NOTE — Progress Notes (Signed)
Patient ID: Barbaraann ShareFaith Cumby, female   DOB: [birth year], 22 y.o.   MRN: [id number]  Called at 2157 to standby for delivery for patient, Ms Criselda PeachesMullen. As I arrived, she was feeling an overwhelming urge to push as SROM had just occurred. As I was gloving she delivered with RN hands-on assistance. I then dried the infant, who had a spont cry, and placed him on pt's abd. Cord clamped and cut by FOB after approx 1 min; hospital cord blood sample collected. Infant to warmer with NICU team arriving concurrently. Pt given Pitocin 10u IM as IV was no longer functioning. Placenta still in place. Dr Mindi SlickerBanga arrived and care turned over to her.  Cam HaiSHAW, Devanee Pomplun CNM 03/03/2018 10:22 PM

## 2018-03-04 LAB — CBC
HEMATOCRIT: 35.3 % — AB (ref 36.0–46.0)
HEMOGLOBIN: 11.9 g/dL — AB (ref 12.0–15.0)
MCH: 30.9 pg (ref 26.0–34.0)
MCHC: 33.7 g/dL (ref 30.0–36.0)
MCV: 91.7 fL (ref 78.0–100.0)
Platelets: 237 10*3/uL (ref 150–400)
RBC: 3.85 MIL/uL — AB (ref 3.87–5.11)
RDW: 13.4 % (ref 11.5–15.5)
WBC: 19.4 10*3/uL — ABNORMAL HIGH (ref 4.0–10.5)

## 2018-03-04 LAB — RPR: RPR Ser Ql: NONREACTIVE

## 2018-03-04 MED ORDER — WITCH HAZEL-GLYCERIN EX PADS: 1.0000 "application " | MEDICATED_PAD | CUTANEOUS | Status: DC | PRN

## 2018-03-04 MED ORDER — ACETAMINOPHEN 325 MG PO TABS
650.0000 mg | ORAL_TABLET | ORAL | Status: DC | PRN
  Administered 2018-03-04 (×2): 650 mg via ORAL
  Filled 2018-03-04 (×2): qty 2

## 2018-03-04 MED ORDER — OXYCODONE HCL 5 MG PO TABS
5.0000 mg | ORAL_TABLET | ORAL | Status: DC | PRN
  Administered 2018-03-04 (×4): 5 mg via ORAL
  Filled 2018-03-04 (×4): qty 1

## 2018-03-04 MED ORDER — ZOLPIDEM TARTRATE 5 MG PO TABS: 5.0000 mg | ORAL_TABLET | Freq: Every evening | ORAL | Status: DC | PRN

## 2018-03-04 MED ORDER — SENNOSIDES-DOCUSATE SODIUM 8.6-50 MG PO TABS
2.0000 | ORAL_TABLET | ORAL | Status: DC
  Administered 2018-03-04: 2 via ORAL
  Filled 2018-03-04: qty 2

## 2018-03-04 MED ORDER — ONDANSETRON HCL 4 MG PO TABS: 4.0000 mg | ORAL_TABLET | ORAL | Status: DC | PRN

## 2018-03-04 MED ORDER — COCONUT OIL OIL: 1.0000 "application " | TOPICAL_OIL | Status: DC | PRN

## 2018-03-04 MED ORDER — DIBUCAINE 1 % RE OINT
1.0000 | TOPICAL_OINTMENT | RECTAL | Status: DC | PRN
Start: 2018-03-04 — End: 2018-03-05

## 2018-03-04 MED ORDER — PRENATAL MULTIVITAMIN CH
1.0000 | ORAL_TABLET | Freq: Every day | ORAL | Status: DC
  Administered 2018-03-04: 1 via ORAL
  Filled 2018-03-04: qty 1

## 2018-03-04 MED ORDER — SIMETHICONE 80 MG PO CHEW: 80.0000 mg | CHEWABLE_TABLET | ORAL | Status: DC | PRN

## 2018-03-04 MED ORDER — BENZOCAINE-MENTHOL 20-0.5 % EX AERO: 1.0000 "application " | INHALATION_SPRAY | CUTANEOUS | Status: DC | PRN

## 2018-03-04 MED ORDER — DIPHENHYDRAMINE HCL 25 MG PO CAPS: 25.0000 mg | ORAL_CAPSULE | Freq: Four times a day (QID) | ORAL | Status: DC | PRN

## 2018-03-04 MED ORDER — ONDANSETRON HCL 4 MG/2ML IJ SOLN: 4.0000 mg | INTRAMUSCULAR | Status: DC | PRN

## 2018-03-04 MED ORDER — OXYCODONE HCL 5 MG PO TABS: 10.0000 mg | ORAL_TABLET | ORAL | Status: DC | PRN

## 2018-03-04 MED ORDER — TETANUS-DIPHTH-ACELL PERTUSSIS 5-2.5-18.5 LF-MCG/0.5 IM SUSP: 0.5000 mL | Freq: Once | INTRAMUSCULAR | Status: DC

## 2018-03-04 NOTE — Plan of Care (Signed)
  Problem: Education: Goal: Knowledge of condition will improve Outcome: Progressing   

## 2018-03-04 NOTE — Progress Notes (Signed)
PPD #1 No problems, baby stable in NICU Afeb, VSS Fundus firm, NT at U-1 Continue routine postpartum care 

## 2018-03-05 MED ORDER — OXYCODONE HCL 5 MG PO TABS: 5.0000 mg | ORAL_TABLET | ORAL | 0 refills | Status: AC | PRN

## 2018-03-05 MED ORDER — IBUPROFEN 600 MG PO TABS: 600.0000 mg | ORAL_TABLET | Freq: Four times a day (QID) | ORAL | 0 refills | Status: AC

## 2018-03-05 MED ORDER — ACETAMINOPHEN 325 MG PO TABS: 650.0000 mg | ORAL_TABLET | ORAL | 0 refills | Status: AC | PRN

## 2018-03-05 MED ORDER — MEDROXYPROGESTERONE ACETATE 150 MG/ML IM SUSP
150.0000 mg | Freq: Once | INTRAMUSCULAR | Status: AC
Start: 2018-03-05 — End: 2018-03-05
  Administered 2018-03-05: 150 mg via INTRAMUSCULAR
  Filled 2018-03-05: qty 1

## 2018-03-05 NOTE — Discharge Summary (Signed)
OB Discharge Summary     Patient Name: Sharon [name] DOB: 09/07/1995 MRN: [id number]  Date of admission: 03/03/2018 Delivering MD: Cam HaiSHAW, KIMBERLY D   Date of discharge: 03/05/2018  Admitting diagnosis: 31WKS, CTX Intrauterine pregnancy: 474w0d     Secondary diagnosis:  Active Problems:   Preterm labor   Pregnancy   Precipitate labor   Postpartum care following vaginal delivery  Additional problems: none     Discharge diagnosis: Preterm Pregnancy Delivered                                                                                                Post partum procedures:Depo provera  Complications: None  Hospital course:  Onset of Labor With Vaginal Delivery     22 y.o. yo G2P0202 at 354w0d was admitted in Active Labor on 03/03/2018. Patient had an uncomplicated labor course as follows:  Membrane Rupture Time/Date: 9:59 PM ,03/03/2018   Intrapartum Procedures: Episiotomy: None [1]                                         Lacerations:  None [1]  Patient had a delivery of a Viable infant. 03/03/2018  Information for the patient's newborn:  Rush FarmerMullen, Boy Maysen [[id number]][[id number]]       Pateint had an uncomplicated postpartum course.  She is ambulating, tolerating a regular diet, passing flatus, and urinating well. Patient is discharged home in stable condition on 03/05/18.   Physical exam  Vitals:   03/04/18 1138 03/04/18 1632 03/04/18 1941 03/05/18 0612  BP: (!) 111/56 125/80 139/74 108/86  Pulse: 73 71 91 60  Resp: 18 18 18 16   Temp: 98 F (36.7 C) 97.7 F (36.5 C) 97.7 F (36.5 C) 97.8 F (36.6 C)  TempSrc:   Oral Oral  SpO2: 98% 98% 98% 98%  Weight:      Height:       General: alert and cooperative Lochia: appropriate Uterine Fundus: firm  Labs: Lab Results  Component Value Date   WBC 19.4 (H) 03/04/2018   HGB 11.9 (L) 03/04/2018   HCT 35.3 (L) 03/04/2018   MCV 91.7 03/04/2018   PLT 237 03/04/2018   CMP Latest Ref Rng & Units 06/20/2017  Glucose 65 - 99 mg/dL  782(N101(H)  BUN 6 - 20 mg/dL 8  Creatinine 5.620.44 - 1.301.00 mg/dL 8.650.59  Sodium 784135 - 696145 mmol/L 140  Potassium 3.5 - 5.1 mmol/L 3.6  Chloride 101 - 111 mmol/L 107  CO2 22 - 32 mmol/L 25  Calcium 8.9 - 10.3 mg/dL 8.3(L)  Total Protein 6.5 - 8.1 g/dL 6.2(L)  Total Bilirubin 0.3 - 1.2 mg/dL 2.9(B0.2(L)  Alkaline Phos 38 - 126 U/L 103  AST 15 - 41 U/L 21  ALT 14 - 54 U/L 16    Discharge instruction: per After Visit Summary and "Baby and Me Booklet".  After visit meds:  Allergies as of 03/05/2018      Reactions   Amoxil [amoxicillin] Hives   Can take Keflex  Penicillins Hives   Has patient had a PCN reaction causing immediate rash, facial/tongue/throat swelling, SOB or lightheadedness with hypotension: Yes Has patient had a PCN reaction causing severe rash involving mucus membranes or skin necrosis: No Has patient had a PCN reaction that required hospitalization: No Has patient had a PCN reaction occurring within the last 10 years: No If all of the above answers are "NO", then may proceed with Cephalosporin use.      Medication List    STOP taking these medications   hydroxyprogesterone caproate 250 mg/mL Oil injection Commonly known as:  MAKENA   oxyCODONE-acetaminophen 5-325 MG tablet Commonly known as:  PERCOCET/ROXICET   tamsulosin 0.4 MG Caps capsule Commonly known as:  FLOMAX     TAKE these medications   acetaminophen 325 MG tablet Commonly known as:  TYLENOL Take 2 tablets (650 mg total) by mouth every 4 (four) hours as needed (for pain scale < 4). What changed:    medication strength  how much to take  when to take this  reasons to take this   ibuprofen 600 MG tablet Commonly known as:  ADVIL,MOTRIN Take 1 tablet (600 mg total) by mouth every 6 (six) hours.   oxyCODONE 5 MG immediate release tablet Commonly known as:  Oxy IR/ROXICODONE Take 1 tablet (5 mg total) by mouth every 4 (four) hours as needed (pain scale 4-7).   PRENATAL 1 PO Take 1 tablet daily by  mouth.       Diet: routine diet  Activity: Advance as tolerated. Pelvic rest for 6 weeks.   Outpatient follow up:6 weeks Follow up Appt:No future appointments. Follow up Visit:No follow-ups on file.  Postpartum contraception: Depo Provera  Newborn Data: Live born female  Birth Weight: 4 lb 5.8 oz (1980 g) APGAR: 9, 10  Newborn Delivery   Birth date/time:  03/03/2018 21:59:00 Delivery type:  Vaginal, Spontaneous     Baby Feeding: Breast Disposition:NICU   03/05/2018 Oliver Pila, MD

## 2018-03-05 NOTE — Progress Notes (Addendum)
Patient discharged home with husband. Medications discussed; follow-up care reviewed; admission discussed; pain management discussed; discharge instructions reviewed; prescriptions reviewed; educated about hypertension in pregnancy. Pt verbalized understanding.   Depo-provera shot given IM L deltoid.

## 2018-03-05 NOTE — Progress Notes (Signed)
Post Partum Day 2 Subjective: no complaints and tolerating PO  Objective: Blood pressure 108/86, pulse 60, temperature 97.8 F (36.6 C), temperature source Oral, resp. rate 16, height 5' 2.01" (1.575 m), weight 102.1 kg (225 lb), last menstrual period 07/29/2017, SpO2 98 %, unknown if currently breastfeeding.  Physical Exam:  General: alert and cooperative Lochia: appropriate Uterine Fundus: firm   Recent Labs    03/03/18 2109 03/04/18 0533  HGB 11.7* 11.9*  HCT 34.8* 35.3*    Assessment/Plan: Discharge home  Depo today Baby stable in NICU, trying on room air   LOS: 2 days   Oliver PilaKathy W Linday Rhodes 03/05/2018, 9:56 AM

## 2019-05-01 IMAGING — US US MFM FETAL BPP W/O NON-STRESS
1 series · 13 of 13 positions shown · non-contrast
Comparison: none

[Series 1: us mfm fetal bpp w/o non-stress · 13 acquisitions, 13 frames shown]
[im 1/13]
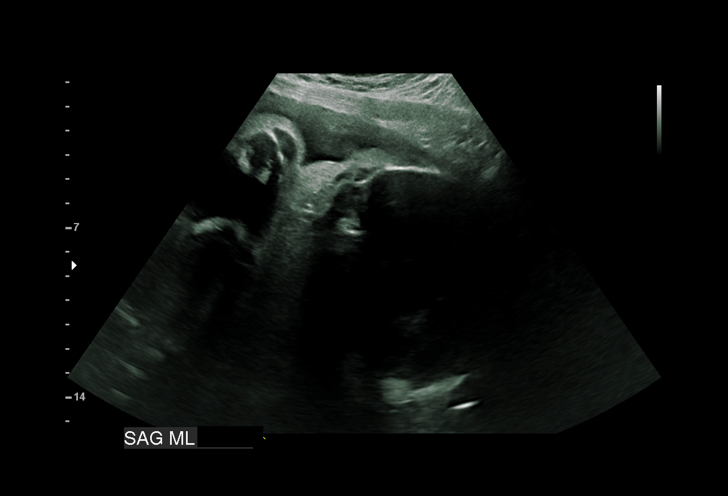
[im 2/13]
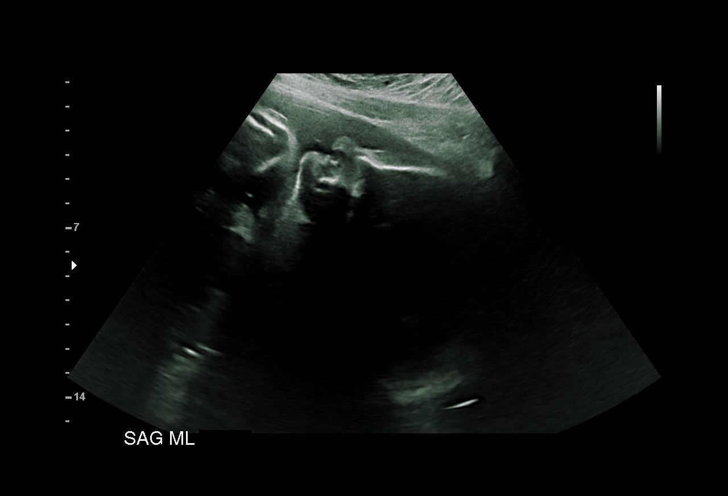
[im 3/13]
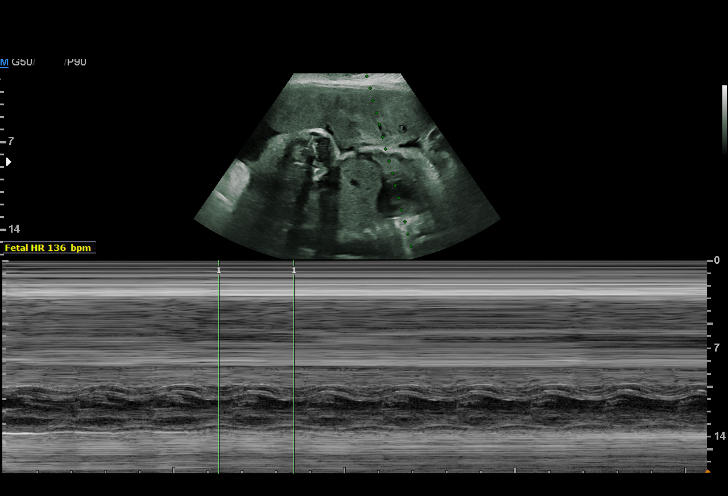
[im 4/13]
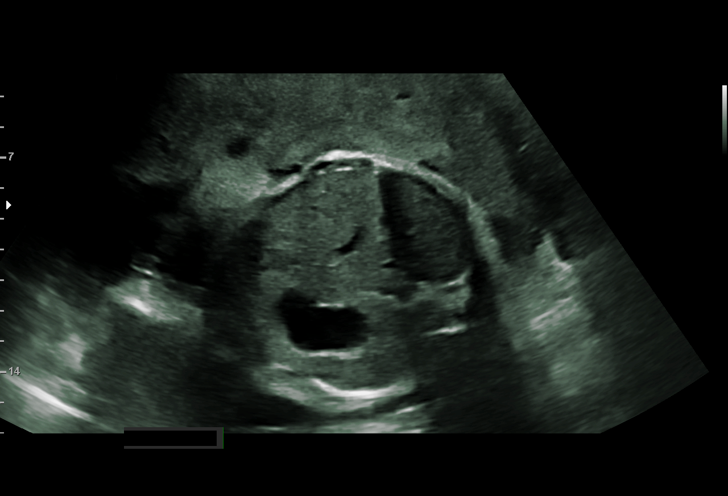
[im 5/13]
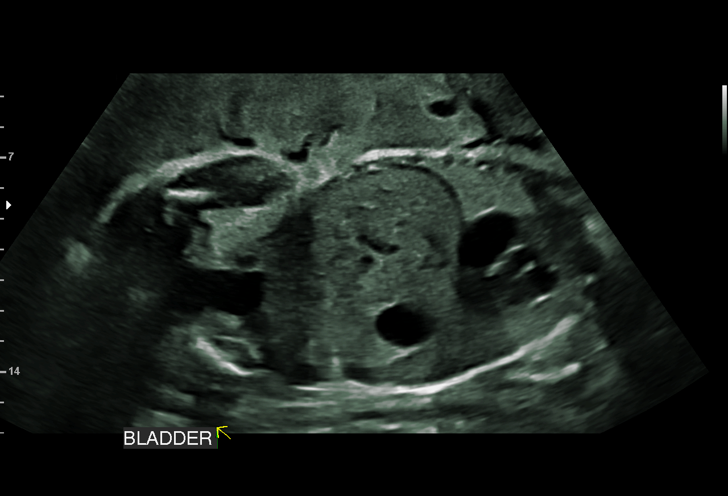
[im 6/13]
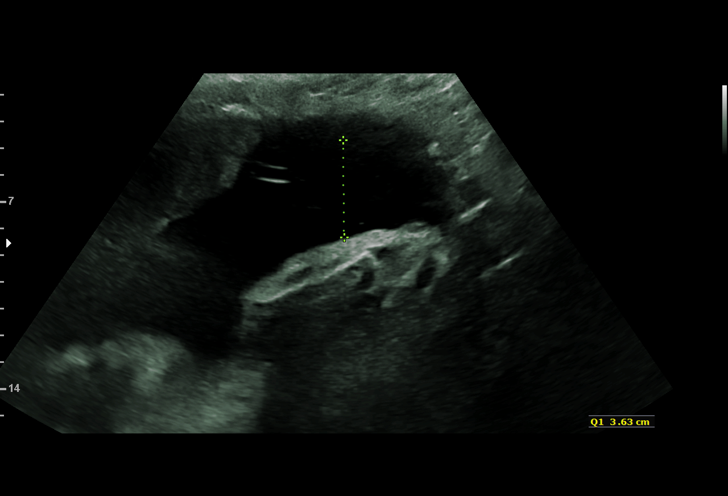
[im 7/13]
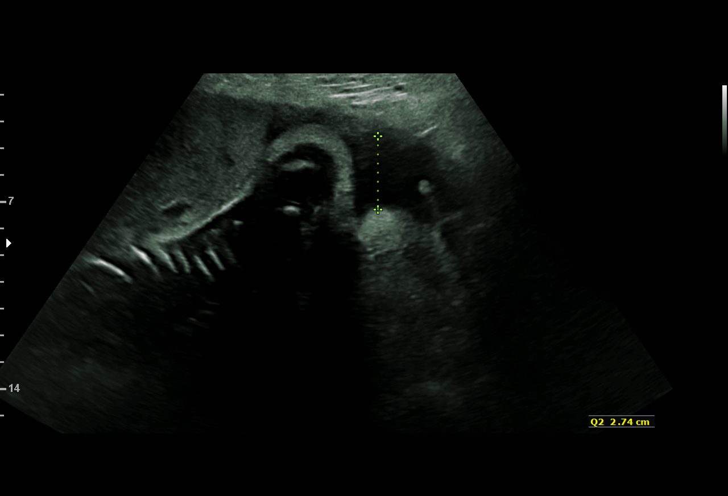
[im 8/13]
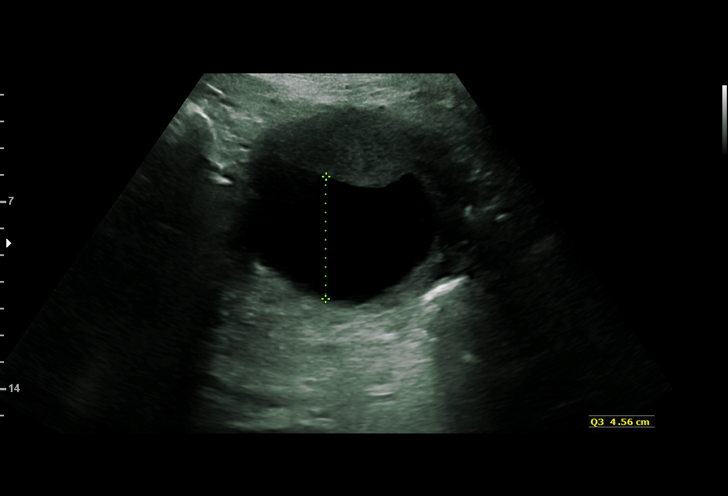
[im 9/13]
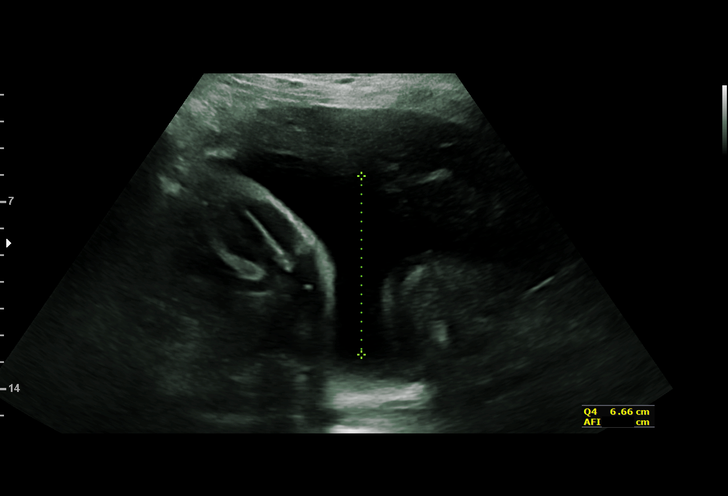
[im 10/13]
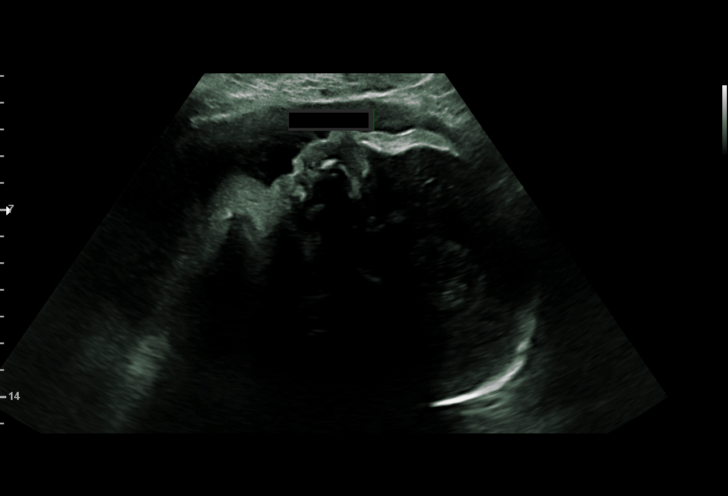
[im 11/13]
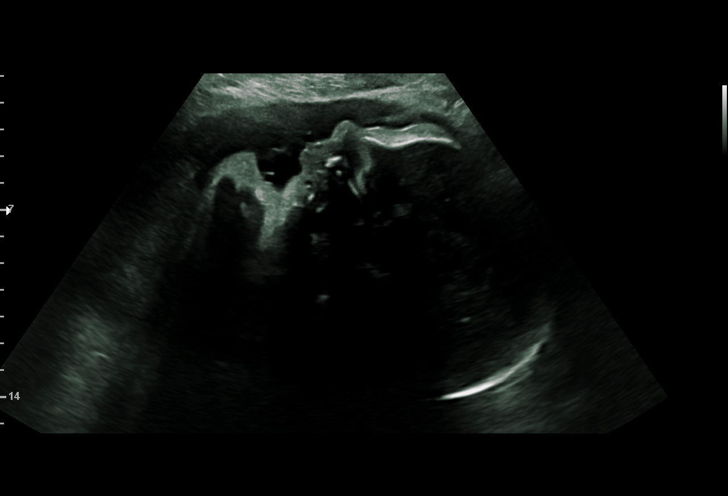
[im 12/13]
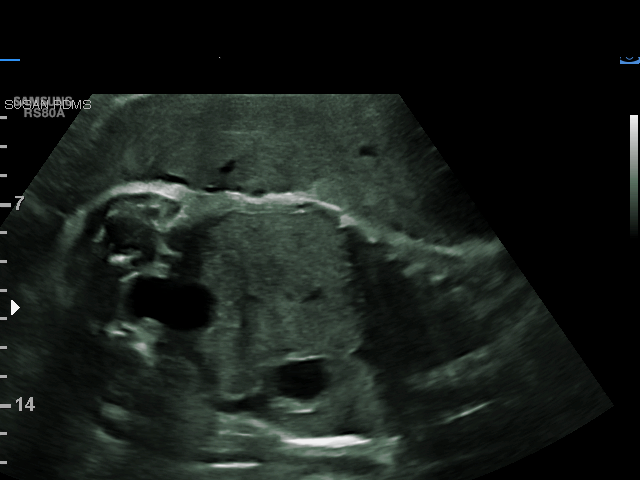
[im 13/13]
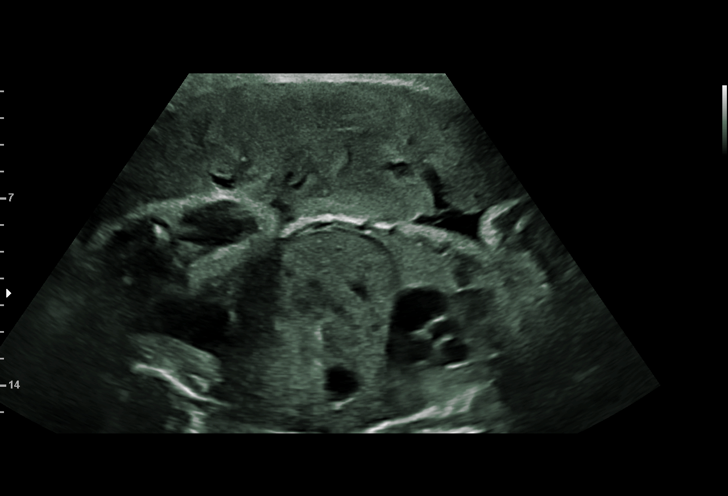

[13 of 13 positions shown; findings below may reference images not displayed]

#101
MAU/Triage

1  AHSENG TIKA             [id number]      [id number]     [id number]
Indications

32 weeks gestation of pregnancy
Decreased fetal movement
Hypertension - Gestational
Fetal abnormality - other known or
suspected (club foot); low risk NIPS; s/p
pediatric ortho consult
Obesity complicating pregnancy, second
trimester (Prepregnancy BMI 39)
Smoking complicating pregnancy, second
trimester
BMZ 10-10, 0000
OB History

Blood Type:            Height:  5'2"   Weight (lb):  213       BMI:
Gravidity:    1         Term:   0        Prem:   0        SAB:   0
TOP:          0       Ectopic:  0        Living: 0
Fetal Evaluation

Num Of Fetuses:     1
Fetal Heart         136
Rate(bpm):
Cardiac Activity:   Observed
Presentation:       Cephalic

Amniotic Fluid
AFI FV:      Subjectively within normal limits

AFI Sum(cm)     %Tile       Largest Pocket(cm)
17.59           65

RUQ(cm)       RLQ(cm)       LUQ(cm)        LLQ(cm)
3.63
Biophysical Evaluation

Amniotic F.V:   Within normal limits       F. Tone:        Observed
F. Movement:    Observed                   Score:          [DATE]
F. Breathing:   Not Observed
Gestational Age

LMP:           32w 1d        Date:  10/10/16                 EDD:   07/17/17
Best:          32w 1d     Det. By:  LMP  (10/10/16)          EDD:   07/17/17
Anatomy

Stomach:               Appears normal, left   Bladder:                Appears normal
sided
Comments

Pt. received pain medication just prior to BPP.
Impression

IUP at 32+1 weeks with gestational HTN, headache, and
decreased fetal movement
Normal amniotic fluid
BPP [DATE]
Recommendations

Continue clinical evaluation and management

## 2019-12-25 IMAGING — US US RENAL
1 series · 15 of 25 positions shown · non-contrast
Comparison: None.

CLINICAL DATA: Hematuria.  Flank pain.  Twenty-four weeks pregnant.

EXAM:
RENAL / URINARY TRACT ULTRASOUND COMPLETE

[Series 1: us renal · 15 of 33 slices shown]
[im 1/33]
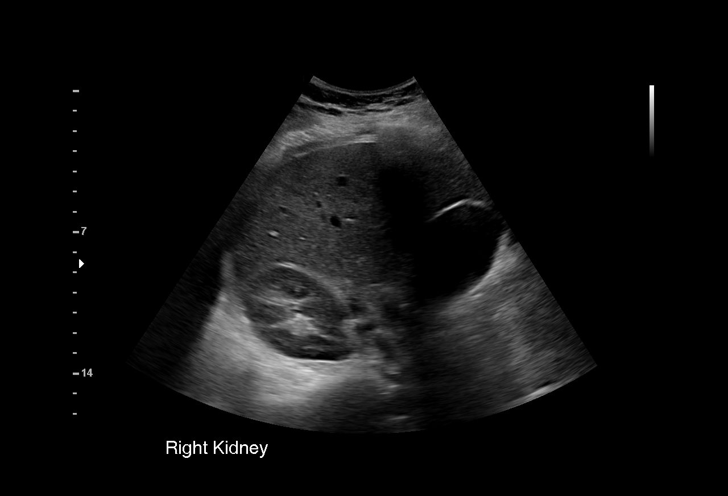
[im 3/33]
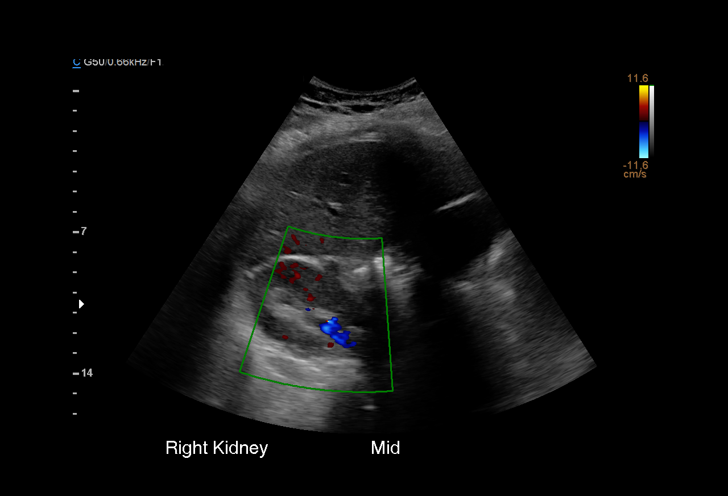
[im 6/33]
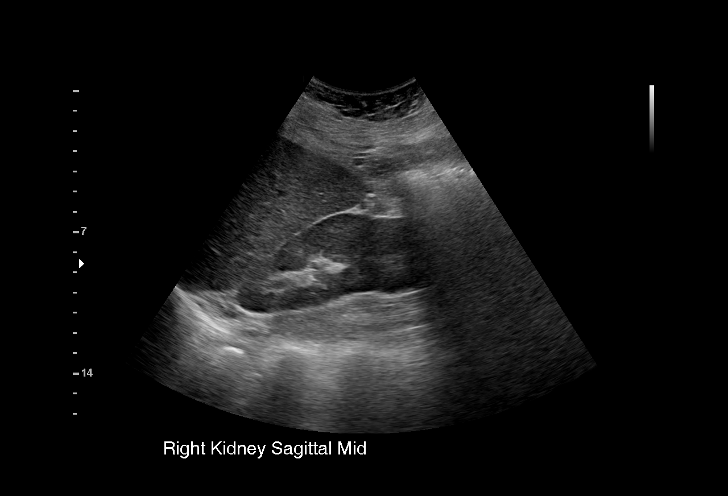
[im 7/33]
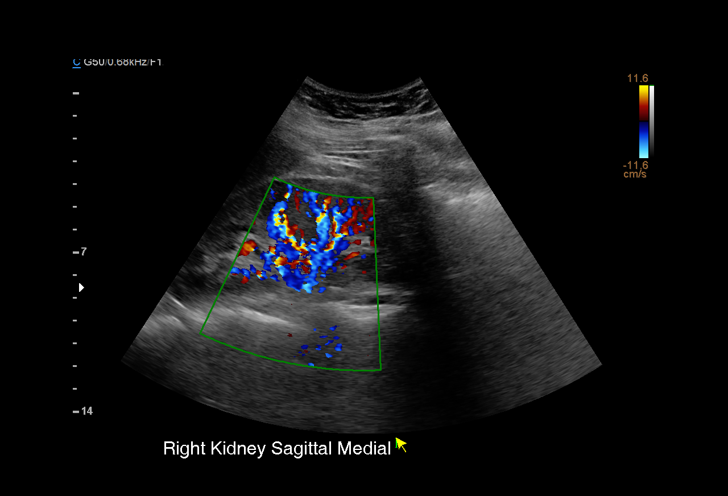
[im 10/33]
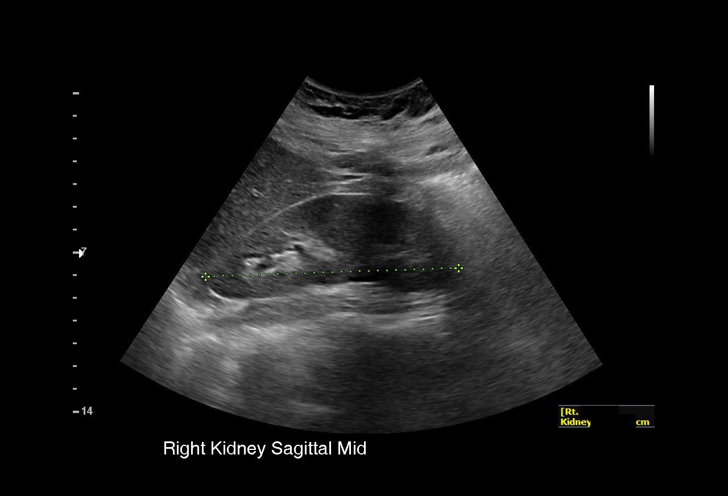
[im 13/33]
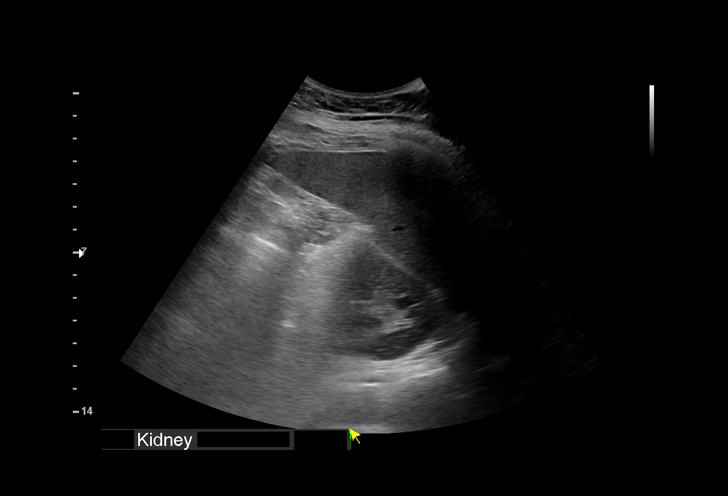
[im 14/33]
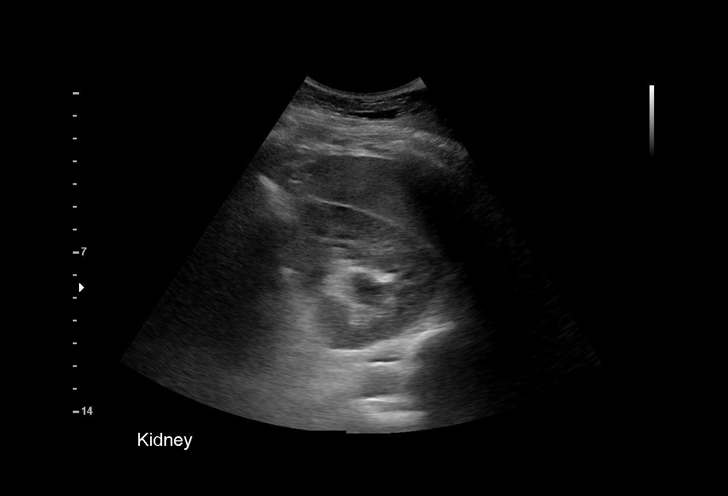
[im 17/33]
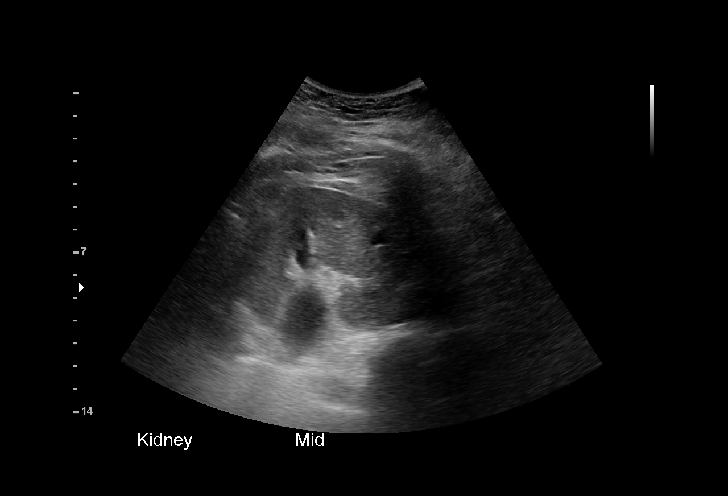
[im 19/33]
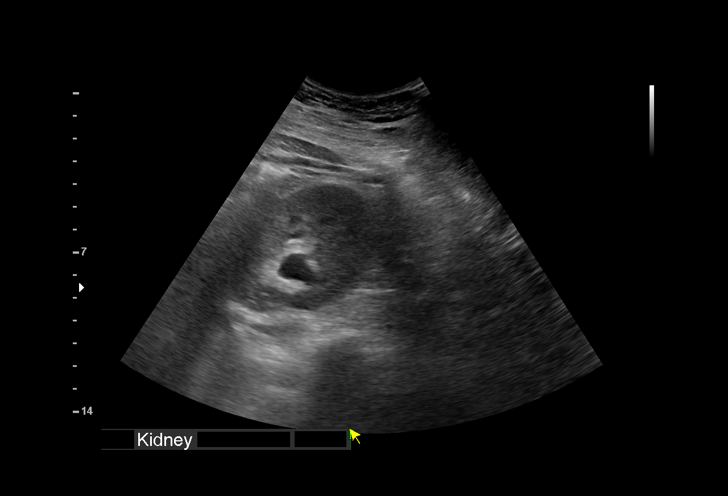
[im 21/33]
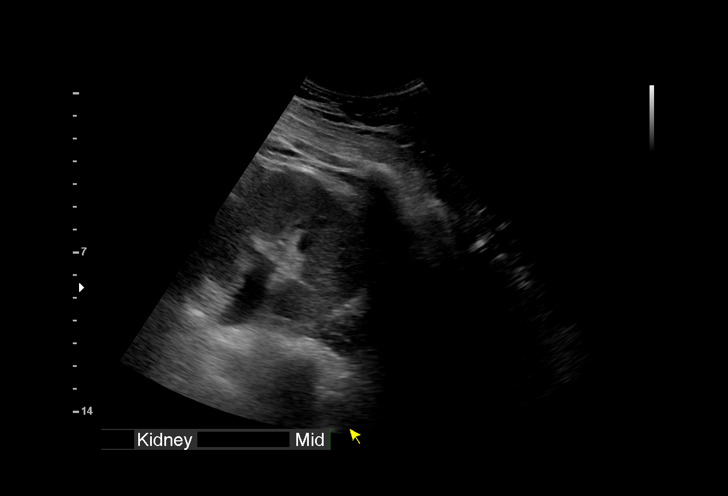
[im 23/33]
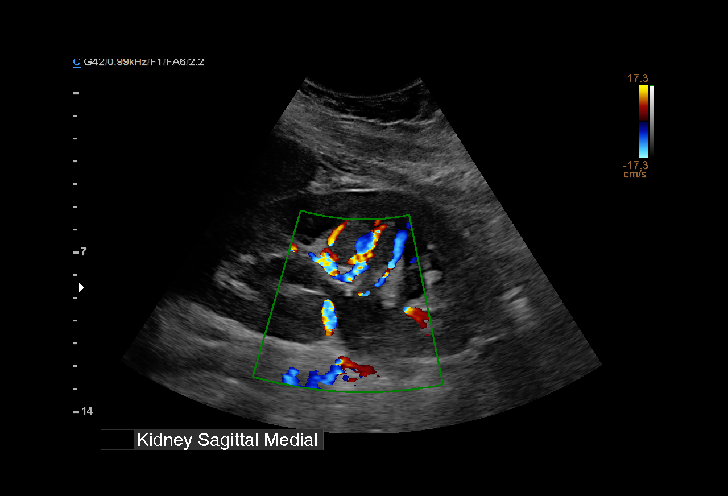
[im 26/33]
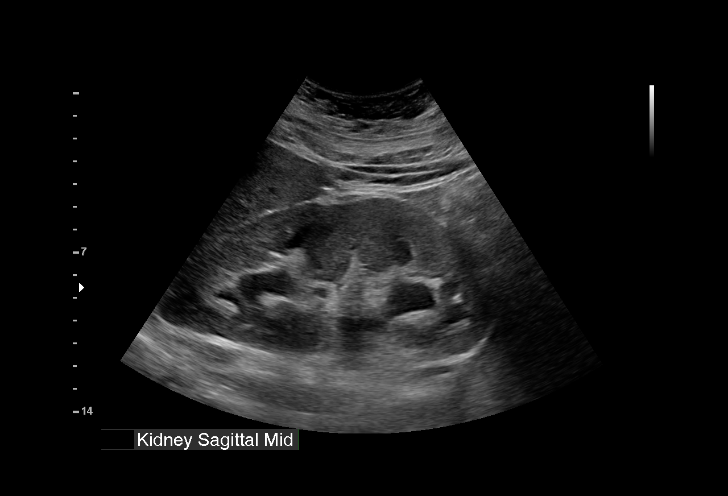
[im 27/33]
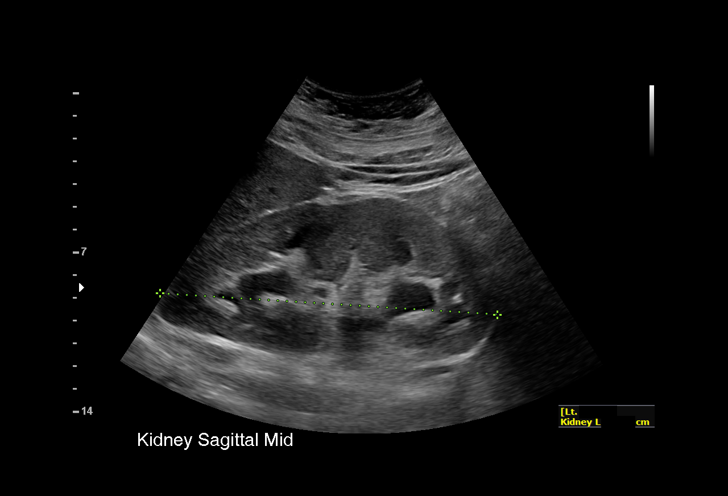
[im 30/33]
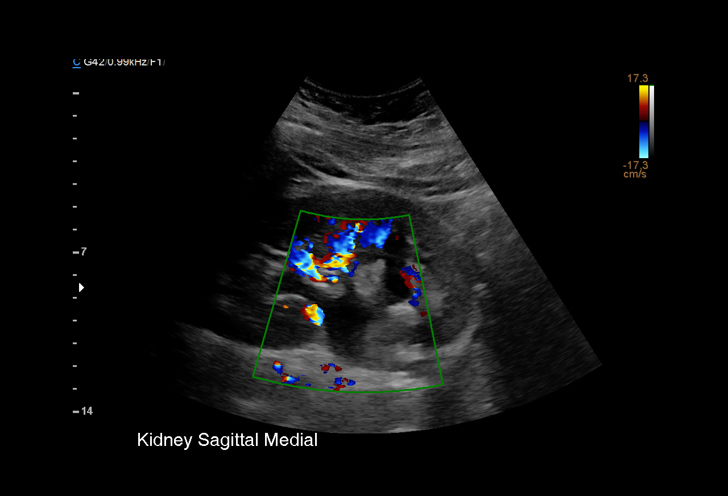
[im 33/33]
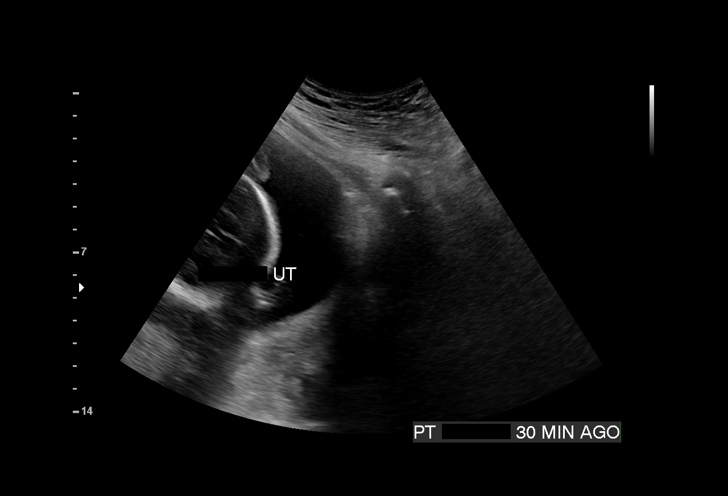

[15 of 25 positions shown; findings below may reference images not displayed]

FINDINGS: Right Kidney:

Length: 11.1 cm. Echogenicity within normal limits. No mass or
hydronephrosis visualized.

Left Kidney:

Length: 14.8 cm. Mild left hydronephrosis with AP left renal pelvis
diameter 11 mm. Mildly echogenic left kidney. Normal left renal
parenchymal thickness. No left renal mass. No left renal stones
demonstrated.

Bladder:

Bladder completely empty and cannot be evaluated on this scan.
IMPRESSION: 1. Mild left hydronephrosis. Obstructing left-sided stone cannot be
excluded.
2. Mildly echogenic and asymmetrically enlarged left kidney,
indicative of nonspecific left renal parenchymal disease of
uncertain chronicity.
3. Normal right kidney with no right hydronephrosis.
4. Bladder completely empty and cannot be evaluated on this scan.
